# Patient Record
Sex: Male | Born: 1950 | Race: White | Hispanic: No | Marital: Married | Smoking: Never smoker
Health system: Southern US, Community
[De-identification: ages and names within clinical notes are randomized; demographics above are authoritative.]

## PROBLEM LIST (undated history)

## (undated) DIAGNOSIS — I509 Heart failure, unspecified: Secondary | ICD-10-CM

## (undated) DIAGNOSIS — R1013 Epigastric pain: Secondary | ICD-10-CM

## (undated) DIAGNOSIS — I429 Cardiomyopathy, unspecified: Secondary | ICD-10-CM

## (undated) DIAGNOSIS — C159 Malignant neoplasm of esophagus, unspecified: Secondary | ICD-10-CM

## (undated) DIAGNOSIS — E669 Obesity, unspecified: Secondary | ICD-10-CM

## (undated) DIAGNOSIS — I10 Essential (primary) hypertension: Secondary | ICD-10-CM

## (undated) HISTORY — DX: Obesity, unspecified: E66.9

## (undated) HISTORY — DX: Malignant neoplasm of esophagus, unspecified: C15.9

## (undated) HISTORY — DX: Essential (primary) hypertension: I10

## (undated) HISTORY — DX: Heart failure, unspecified: I50.9

## (undated) HISTORY — DX: Hypercalcemia: E83.52

## (undated) HISTORY — DX: Cardiomyopathy, unspecified: I42.9

## (undated) HISTORY — DX: Epigastric pain: R10.13

---

## 2006-09-21 ENCOUNTER — Ambulatory Visit: Payer: Self-pay | Admitting: Cardiology

## 2006-09-21 ENCOUNTER — Inpatient Hospital Stay: Payer: Self-pay | Admitting: Internal Medicine

## 2006-09-21 ENCOUNTER — Other Ambulatory Visit: Payer: Self-pay

## 2006-09-22 ENCOUNTER — Other Ambulatory Visit: Payer: Self-pay

## 2006-10-02 ENCOUNTER — Ambulatory Visit: Payer: Self-pay | Admitting: Internal Medicine

## 2006-10-18 ENCOUNTER — Ambulatory Visit: Payer: Self-pay | Admitting: Internal Medicine

## 2006-10-31 ENCOUNTER — Ambulatory Visit: Payer: Self-pay | Admitting: Cardiology

## 2006-11-05 ENCOUNTER — Ambulatory Visit: Payer: Self-pay | Admitting: Internal Medicine

## 2006-11-08 ENCOUNTER — Ambulatory Visit: Payer: Self-pay | Admitting: Internal Medicine

## 2006-11-26 ENCOUNTER — Ambulatory Visit: Payer: Self-pay | Admitting: Internal Medicine

## 2006-11-27 ENCOUNTER — Ambulatory Visit: Payer: Self-pay | Admitting: Internal Medicine

## 2006-11-27 ENCOUNTER — Encounter: Payer: Self-pay | Admitting: Internal Medicine

## 2006-12-17 ENCOUNTER — Ambulatory Visit: Payer: Self-pay | Admitting: Gastroenterology

## 2006-12-19 ENCOUNTER — Ambulatory Visit: Payer: Self-pay | Admitting: Internal Medicine

## 2007-01-15 ENCOUNTER — Encounter: Payer: Self-pay | Admitting: Internal Medicine

## 2007-01-15 ENCOUNTER — Ambulatory Visit: Payer: Self-pay

## 2007-01-18 ENCOUNTER — Ambulatory Visit: Payer: Self-pay | Admitting: Internal Medicine

## 2007-01-22 ENCOUNTER — Ambulatory Visit: Payer: Self-pay | Admitting: Internal Medicine

## 2007-01-22 ENCOUNTER — Ambulatory Visit (HOSPITAL_COMMUNITY): Admission: RE | Admit: 2007-01-22 | Discharge: 2007-01-22 | Payer: Self-pay | Admitting: Internal Medicine

## 2007-02-06 ENCOUNTER — Ambulatory Visit: Payer: Self-pay | Admitting: Internal Medicine

## 2007-02-06 HISTORY — PX: OTHER SURGICAL HISTORY: SHX169

## 2007-02-11 ENCOUNTER — Ambulatory Visit: Payer: Self-pay | Admitting: Internal Medicine

## 2007-02-21 ENCOUNTER — Ambulatory Visit: Payer: Self-pay | Admitting: Internal Medicine

## 2007-02-21 ENCOUNTER — Inpatient Hospital Stay (HOSPITAL_COMMUNITY): Admission: RE | Admit: 2007-02-21 | Discharge: 2007-02-22 | Payer: Self-pay | Admitting: Internal Medicine

## 2007-03-07 ENCOUNTER — Encounter: Payer: Self-pay | Admitting: Internal Medicine

## 2007-03-07 ENCOUNTER — Ambulatory Visit: Payer: Self-pay

## 2007-03-07 ENCOUNTER — Ambulatory Visit: Payer: Self-pay | Admitting: Internal Medicine

## 2007-03-11 ENCOUNTER — Ambulatory Visit: Payer: Self-pay | Admitting: Internal Medicine

## 2007-03-25 ENCOUNTER — Ambulatory Visit: Payer: Self-pay | Admitting: Internal Medicine

## 2007-04-19 ENCOUNTER — Ambulatory Visit: Payer: Self-pay | Admitting: Internal Medicine

## 2007-05-28 ENCOUNTER — Ambulatory Visit: Payer: Self-pay

## 2007-05-28 ENCOUNTER — Encounter: Payer: Self-pay | Admitting: Internal Medicine

## 2007-06-07 ENCOUNTER — Ambulatory Visit: Payer: Self-pay | Admitting: Internal Medicine

## 2007-06-10 ENCOUNTER — Ambulatory Visit: Payer: Self-pay

## 2007-06-12 ENCOUNTER — Ambulatory Visit: Payer: Self-pay | Admitting: Internal Medicine

## 2007-06-25 ENCOUNTER — Ambulatory Visit: Payer: Self-pay | Admitting: Internal Medicine

## 2007-07-02 ENCOUNTER — Ambulatory Visit: Payer: Self-pay | Admitting: Internal Medicine

## 2007-07-02 LAB — CONVERTED CEMR LAB
CO2: 23 meq/L (ref 19–32)
Creatinine, Ser: 1.04 mg/dL (ref 0.40–1.50)
Sodium: 137 meq/L (ref 135–145)

## 2007-07-18 ENCOUNTER — Ambulatory Visit: Payer: Self-pay | Admitting: Internal Medicine

## 2007-08-10 DIAGNOSIS — K3189 Other diseases of stomach and duodenum: Secondary | ICD-10-CM

## 2007-08-10 DIAGNOSIS — R1013 Epigastric pain: Secondary | ICD-10-CM

## 2007-08-10 DIAGNOSIS — I10 Essential (primary) hypertension: Secondary | ICD-10-CM | POA: Insufficient documentation

## 2007-08-10 DIAGNOSIS — I509 Heart failure, unspecified: Secondary | ICD-10-CM | POA: Insufficient documentation

## 2007-08-10 DIAGNOSIS — E669 Obesity, unspecified: Secondary | ICD-10-CM

## 2007-09-09 ENCOUNTER — Ambulatory Visit: Payer: Self-pay | Admitting: Internal Medicine

## 2007-09-11 ENCOUNTER — Ambulatory Visit: Payer: Self-pay | Admitting: Internal Medicine

## 2007-11-27 ENCOUNTER — Encounter: Payer: Self-pay | Admitting: Internal Medicine

## 2007-11-27 ENCOUNTER — Ambulatory Visit: Payer: Self-pay | Admitting: Internal Medicine

## 2007-12-09 ENCOUNTER — Ambulatory Visit: Payer: Self-pay | Admitting: Internal Medicine

## 2007-12-13 ENCOUNTER — Ambulatory Visit: Payer: Self-pay | Admitting: Internal Medicine

## 2007-12-17 ENCOUNTER — Encounter: Payer: Self-pay | Admitting: Internal Medicine

## 2007-12-17 ENCOUNTER — Ambulatory Visit: Payer: Self-pay | Admitting: Cardiovascular Disease

## 2007-12-17 LAB — CONVERTED CEMR LAB
AST: 17 units/L (ref 0–37)
Albumin: 4 g/dL (ref 3.5–5.2)
Alkaline Phosphatase: 66 units/L (ref 39–117)
BUN: 12 mg/dL (ref 6–23)
CO2: 22 meq/L (ref 19–32)
Chloride: 104 meq/L (ref 96–112)
Cholesterol: 139 mg/dL (ref 0–200)
Creatinine, Ser: 1.07 mg/dL (ref 0.40–1.50)
Glucose, Bld: 96 mg/dL (ref 70–99)
Total Bilirubin: 0.4 mg/dL (ref 0.3–1.2)
Total CHOL/HDL Ratio: 7
Total Protein: 6.9 g/dL (ref 6.0–8.3)
Triglycerides: 267 mg/dL — ABNORMAL HIGH (ref <150)

## 2008-01-03 ENCOUNTER — Ambulatory Visit: Payer: Self-pay | Admitting: Cardiology

## 2008-01-03 LAB — CONVERTED CEMR LAB
Albumin, U: DETECTED %
Alpha 1, Urine: DETECTED % — AB
Alpha 2, Urine: DETECTED % — AB
Alpha-2-Globulin: 11.5 % (ref 7.1–11.8)
Beta, Urine: DETECTED % — AB
Free Kappa Lt Chains,Ur: 8.18 mg/dL — ABNORMAL HIGH (ref 0.04–1.51)
Free Kappa/Lambda Ratio: 13.86 — ABNORMAL HIGH (ref 0.46–4.00)
Free Lambda Lt Chains,Ur: 0.59 mg/dL (ref 0.08–1.01)
Gamma Globulin: 16.1 % (ref 11.1–18.8)
Total Protein, Urine-Ur/day: 100 mg/24hr (ref 10–140)

## 2008-01-08 ENCOUNTER — Ambulatory Visit: Payer: Self-pay | Admitting: Oncology

## 2008-01-09 LAB — CBC WITH DIFFERENTIAL (CANCER CENTER ONLY)
BASO#: 0.1 10*3/uL (ref 0.0–0.2)
Eosinophils Absolute: 0.4 10*3/uL (ref 0.0–0.5)
HGB: 14.8 g/dL (ref 13.0–17.1)
MCV: 76 fL — ABNORMAL LOW (ref 82–98)
MONO#: 0.4 10*3/uL (ref 0.1–0.9)
NEUT#: 5.6 10*3/uL (ref 1.5–6.5)
Platelets: 202 10*3/uL (ref 145–400)
RBC: 5.8 10*6/uL — ABNORMAL HIGH (ref 4.20–5.70)
WBC: 8.3 10*3/uL (ref 4.0–10.0)

## 2008-01-15 LAB — COMPREHENSIVE METABOLIC PANEL
ALT: 16 U/L (ref 0–53)
AST: 23 U/L (ref 0–37)
Albumin: 3.9 g/dL (ref 3.5–5.2)
BUN: 10 mg/dL (ref 6–23)
Chloride: 102 mEq/L (ref 96–112)
Creatinine, Ser: 1.13 mg/dL (ref 0.40–1.50)
Sodium: 135 mEq/L (ref 135–145)
Total Protein: 6.8 g/dL (ref 6.0–8.3)

## 2008-01-15 LAB — SPEP & IFE WITH QIG
Beta 2: 5.6 % (ref 3.2–6.5)
Gamma Globulin: 16.3 % (ref 11.1–18.8)
IgA: 344 mg/dL (ref 68–378)
IgG (Immunoglobin G), Serum: 1300 mg/dL (ref 694–1618)

## 2008-01-15 LAB — HEMOGLOBINOPATHY EVALUATION
Hemoglobin Other: 0 % (ref 0.0–0.0)
Hgb A2 Quant: 2.5 % (ref 2.2–3.2)

## 2008-01-15 LAB — FERRITIN: Ferritin: 40 ng/mL (ref 22–322)

## 2008-01-15 LAB — IRON AND TIBC
Iron: 60 ug/dL (ref 42–165)
TIBC: 383 ug/dL (ref 215–435)
UIBC: 323 ug/dL

## 2008-01-15 LAB — KAPPA/LAMBDA LIGHT CHAINS: Kappa:Lambda Ratio: 0.73 (ref 0.26–1.65)

## 2008-01-16 LAB — UIFE/LIGHT CHAINS/TP QN, 24-HR UR
Albumin, U: DETECTED
Beta, Urine: DETECTED — AB
Free Lambda Excretion/Day: 5.6 mg/d
Free Lambda Lt Chains,Ur: 0.56 mg/dL (ref 0.08–1.01)
Free Lt Chn Excr Rate: 85.5 mg/d
Time: 24 hours
Total Protein, Urine-Ur/day: 113 mg/d (ref 10–140)
Total Protein, Urine: 11.3 mg/dL
Volume, Urine: 1000 mL

## 2008-01-25 LAB — PTH RELATED PEPTIDE: PTH-related peptide: <0.2 pmol/L (ref <1.4)

## 2008-01-25 LAB — MAGNESIUM: Magnesium: 2.2 mg/dL (ref 1.5–2.5)

## 2008-03-11 ENCOUNTER — Ambulatory Visit (HOSPITAL_COMMUNITY): Admission: RE | Admit: 2008-03-11 | Discharge: 2008-03-11 | Payer: Self-pay | Admitting: Internal Medicine

## 2008-03-19 ENCOUNTER — Ambulatory Visit: Payer: Self-pay | Admitting: Internal Medicine

## 2008-03-27 ENCOUNTER — Ambulatory Visit: Payer: Self-pay | Admitting: Cardiology

## 2008-03-27 LAB — CONVERTED CEMR LAB
AST: 18 units/L (ref 0–37)
Albumin: 4.2 g/dL (ref 3.5–5.2)
Alkaline Phosphatase: 89 units/L (ref 39–117)
BUN: 13 mg/dL (ref 6–23)
Calcium: 11.5 mg/dL — ABNORMAL HIGH (ref 8.4–10.5)
Chloride: 104 meq/L (ref 96–112)
Cholesterol: 176 mg/dL (ref 0–200)
Creatinine, Ser: 1.21 mg/dL (ref 0.40–1.50)
Potassium: 4.7 meq/L (ref 3.5–5.3)
Sodium: 141 meq/L (ref 135–145)
Total Bilirubin: 0.4 mg/dL (ref 0.3–1.2)
Total Protein: 7.2 g/dL (ref 6.0–8.3)
VLDL: 30 mg/dL (ref 0–40)

## 2008-04-24 ENCOUNTER — Ambulatory Visit: Payer: Self-pay | Admitting: Cardiology

## 2008-04-24 LAB — CONVERTED CEMR LAB
BUN: 10 mg/dL (ref 6–23)
Glucose, Bld: 87 mg/dL (ref 70–99)
Sodium: 137 meq/L (ref 135–145)

## 2008-04-27 ENCOUNTER — Ambulatory Visit: Payer: Self-pay | Admitting: Oncology

## 2008-04-27 ENCOUNTER — Ambulatory Visit: Payer: Self-pay | Admitting: Internal Medicine

## 2008-07-15 ENCOUNTER — Encounter (INDEPENDENT_AMBULATORY_CARE_PROVIDER_SITE_OTHER): Payer: Self-pay | Admitting: *Deleted

## 2008-07-15 ENCOUNTER — Encounter: Payer: Self-pay | Admitting: Internal Medicine

## 2008-07-15 ENCOUNTER — Ambulatory Visit: Payer: Self-pay | Admitting: Internal Medicine

## 2008-07-15 DIAGNOSIS — E785 Hyperlipidemia, unspecified: Secondary | ICD-10-CM

## 2008-07-15 DIAGNOSIS — I951 Orthostatic hypotension: Secondary | ICD-10-CM

## 2008-07-15 DIAGNOSIS — I10 Essential (primary) hypertension: Secondary | ICD-10-CM | POA: Insufficient documentation

## 2008-07-15 DIAGNOSIS — I5022 Chronic systolic (congestive) heart failure: Secondary | ICD-10-CM

## 2008-07-16 ENCOUNTER — Encounter: Payer: Self-pay | Admitting: Internal Medicine

## 2008-07-23 ENCOUNTER — Ambulatory Visit: Payer: Self-pay | Admitting: Oncology

## 2008-07-27 ENCOUNTER — Ambulatory Visit: Payer: Self-pay | Admitting: Internal Medicine

## 2008-08-18 ENCOUNTER — Encounter: Payer: Self-pay | Admitting: Internal Medicine

## 2008-08-18 ENCOUNTER — Ambulatory Visit: Payer: Self-pay

## 2008-10-26 ENCOUNTER — Ambulatory Visit: Payer: Self-pay | Admitting: Internal Medicine

## 2008-11-19 ENCOUNTER — Encounter: Payer: Self-pay | Admitting: Internal Medicine

## 2008-11-24 ENCOUNTER — Telehealth: Payer: Self-pay | Admitting: Internal Medicine

## 2008-11-27 ENCOUNTER — Ambulatory Visit: Payer: Self-pay | Admitting: Internal Medicine

## 2009-01-31 ENCOUNTER — Encounter: Payer: Self-pay | Admitting: Internal Medicine

## 2009-02-01 ENCOUNTER — Ambulatory Visit: Payer: Self-pay | Admitting: Internal Medicine

## 2009-02-22 ENCOUNTER — Telehealth: Payer: Self-pay | Admitting: Internal Medicine

## 2009-02-25 ENCOUNTER — Encounter: Payer: Self-pay | Admitting: Internal Medicine

## 2009-03-05 ENCOUNTER — Telehealth (INDEPENDENT_AMBULATORY_CARE_PROVIDER_SITE_OTHER): Payer: Self-pay | Admitting: *Deleted

## 2009-05-10 ENCOUNTER — Telehealth: Payer: Self-pay | Admitting: Internal Medicine

## 2009-06-24 ENCOUNTER — Encounter: Payer: Self-pay | Admitting: Internal Medicine

## 2009-06-24 ENCOUNTER — Ambulatory Visit: Payer: Self-pay | Admitting: Internal Medicine

## 2009-06-24 DIAGNOSIS — R9431 Abnormal electrocardiogram [ECG] [EKG]: Secondary | ICD-10-CM | POA: Insufficient documentation

## 2009-10-25 ENCOUNTER — Telehealth: Payer: Self-pay | Admitting: Internal Medicine

## 2009-12-17 ENCOUNTER — Encounter (INDEPENDENT_AMBULATORY_CARE_PROVIDER_SITE_OTHER): Payer: Self-pay | Admitting: *Deleted

## 2009-12-28 ENCOUNTER — Telehealth: Payer: Self-pay | Admitting: Internal Medicine

## 2010-01-21 ENCOUNTER — Ambulatory Visit: Payer: Self-pay | Admitting: Internal Medicine

## 2010-02-02 ENCOUNTER — Telehealth: Payer: Self-pay | Admitting: Internal Medicine

## 2010-02-09 ENCOUNTER — Telehealth: Payer: Self-pay | Admitting: Internal Medicine

## 2010-02-14 ENCOUNTER — Ambulatory Visit: Payer: Self-pay | Admitting: Internal Medicine

## 2010-02-22 LAB — CONVERTED CEMR LAB
BUN: 10 mg/dL (ref 6–23)
Basophils Absolute: 0.1 10*3/uL (ref 0.0–0.1)
Basophils Relative: 1 % (ref 0–1)
CO2: 25 meq/L (ref 19–32)
Calcium: 11.3 mg/dL — ABNORMAL HIGH (ref 8.4–10.5)
Chloride: 109 meq/L (ref 96–112)
Creatinine, Ser: 1.54 mg/dL — ABNORMAL HIGH (ref 0.40–1.50)
Eosinophils Absolute: 0.5 10*3/uL (ref 0.0–0.7)
Glucose, Bld: 83 mg/dL (ref 70–99)
MCV: 75.5 fL — ABNORMAL LOW (ref 78.0–100.0)
Neutro Abs: 6.8 10*3/uL (ref 1.7–7.7)
Potassium: 4.1 meq/L (ref 3.5–5.3)
RBC: 5.59 M/uL (ref 4.22–5.81)
RDW: 16.3 % — ABNORMAL HIGH (ref 11.5–15.5)
WBC: 11 10*3/uL — ABNORMAL HIGH (ref 4.0–10.5)

## 2010-03-07 ENCOUNTER — Ambulatory Visit: Payer: Self-pay | Admitting: Gastroenterology

## 2010-03-07 ENCOUNTER — Encounter: Payer: Self-pay | Admitting: Gastroenterology

## 2010-03-08 DIAGNOSIS — C159 Malignant neoplasm of esophagus, unspecified: Secondary | ICD-10-CM

## 2010-03-08 HISTORY — DX: Malignant neoplasm of esophagus, unspecified: C15.9

## 2010-03-10 LAB — PATHOLOGY REPORT

## 2010-03-15 ENCOUNTER — Ambulatory Visit: Payer: Self-pay | Admitting: Gastroenterology

## 2010-03-15 ENCOUNTER — Encounter: Payer: Self-pay | Admitting: Gastroenterology

## 2010-03-17 ENCOUNTER — Ambulatory Visit: Payer: Self-pay | Admitting: Internal Medicine

## 2010-03-21 ENCOUNTER — Ambulatory Visit: Payer: Self-pay | Admitting: Internal Medicine

## 2010-03-21 ENCOUNTER — Encounter: Payer: Self-pay | Admitting: Gastroenterology

## 2010-03-22 ENCOUNTER — Ambulatory Visit: Payer: Self-pay | Admitting: Internal Medicine

## 2010-03-23 ENCOUNTER — Encounter (INDEPENDENT_AMBULATORY_CARE_PROVIDER_SITE_OTHER): Payer: Self-pay | Admitting: *Deleted

## 2010-03-23 ENCOUNTER — Telehealth (INDEPENDENT_AMBULATORY_CARE_PROVIDER_SITE_OTHER): Payer: Self-pay | Admitting: *Deleted

## 2010-03-23 DIAGNOSIS — C159 Malignant neoplasm of esophagus, unspecified: Secondary | ICD-10-CM | POA: Insufficient documentation

## 2010-03-24 ENCOUNTER — Ambulatory Visit (HOSPITAL_COMMUNITY)
Admission: RE | Admit: 2010-03-24 | Discharge: 2010-03-24 | Payer: Self-pay | Source: Home / Self Care | Admitting: Gastroenterology

## 2010-03-24 ENCOUNTER — Ambulatory Visit: Payer: Self-pay | Admitting: Gastroenterology

## 2010-04-07 ENCOUNTER — Ambulatory Visit: Payer: Self-pay | Admitting: Internal Medicine

## 2010-04-12 ENCOUNTER — Ambulatory Visit: Payer: Self-pay | Admitting: Internal Medicine

## 2010-05-06 ENCOUNTER — Inpatient Hospital Stay: Payer: Self-pay | Admitting: Internal Medicine

## 2010-05-08 ENCOUNTER — Ambulatory Visit: Payer: Self-pay | Admitting: Internal Medicine

## 2010-06-01 ENCOUNTER — Ambulatory Visit
Admission: RE | Admit: 2010-06-01 | Discharge: 2010-06-01 | Payer: Self-pay | Source: Home / Self Care | Attending: Internal Medicine | Admitting: Internal Medicine

## 2010-06-01 ENCOUNTER — Encounter: Payer: Self-pay | Admitting: Internal Medicine

## 2010-06-08 ENCOUNTER — Ambulatory Visit: Payer: Self-pay | Admitting: Internal Medicine

## 2010-06-08 NOTE — Letter (Signed)
Summary: Device-Delinquent Check  [facility], Main Office  [address]   [place], [place] [address]   Phone: [phone]  Fax: [phone]     December 17, 2009 MRN: [id number]   [name] [address] [place], [place]  [address]   Dear Luke Gill,  According to our records, you have not had your implanted device checked in the recommended period of time.  We are unable to determine appropriate device function without checking your device on a regular basis.  Please call our office to schedule an appointment, with Dr. Graciela Husbands,  as soon as possible.  If you are having your device checked by another physician, please call us so that we may update our records.  Thank you, Altha Harm, LPN  December 17, 2009 3:26 PM   [facility]

## 2010-06-08 NOTE — Progress Notes (Signed)
Summary: BP PROBLEMS   Phone Note Call from Patient Call back at [phone] EXT [phone]   Caller: SELF Call For: BENSIMHON Summary of Call: BP HAS DROPPED DRAMATICALLY-BEEN AS LOW AS 88/50 AND THE HIGHEST HAS BEEN 105/70 SINCE YESTERDAY Initial call taken by: Harlon Flor,  May 10, 2009 9:02 AM  Follow-up for Phone Call        pt states that he has had 1-2 episodes of lightheadedness, on the verge of blacking out since weekend.  states that he has been more active than normal over the last several days with taking down [employer]. admits that he has not been drinking as much fluids as he should.  he states that there is a fine balance since he has heart failure and should min. fluids but is told to drink plenty of fluids by his endocrinologist for parathyroidism.  encouraged pt to increase fluid intake a little and to monitor BP.  Will call back on Thur or Frid for f.u Follow-up by: Charlena Cross, RN, BSN,  May 10, 2009 12:18 PM

## 2010-06-08 NOTE — Progress Notes (Signed)
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 Dossie Arbour.  pt will call Dr. Christell Faith office. pt also wanted to make an appt to see paula in device clinic first available appt was sept [address] Benedict Needy, RN  December 28, 2009 2:42 PM

## 2010-06-08 NOTE — Progress Notes (Signed)
Summary: blood pressure  Medications Added LISINOPRIL 20 MG TABS (LISINOPRIL) Take 1 tablet by mouth once a day       Phone Note Call from Patient   Caller: Patient Summary of Call: For the past few days blood pressure running low and felt dizzy and lightheaded yesterday.  BP reading 78/58 with a HR of 90. Just got home from work and BP 86/62 and HR of 93.  Went on a CPAP machine 3 weeks ago and was told to watch his blood pressure because during the test his heart rate dropped from 100 to 85.  What should do?  Please leave a message on home phone or call the pat before 3:30 at work.  #[phone] Initial call taken by: Bishop Dublin, CMA,  February 09, 2010 10:01 AM  Follow-up for Phone Call        Spoke to pt BP's been low for the past few days. Started on Cpap 3 weeks ago. BP this morning was 98/80 but held metoprolol and lisinopril and spironlactone. Please advise.  Benedict Needy, RN  February 10, 2010 11:20 AM   Additional Follow-up for Phone Call Additional follow up Details #1::        decrease lisinopril to 20 once daily and lets see if that helps. check bmet, bnp and cbc on monday  Dolores Patty, MD, [facility]  February 10, 2010 7:29 PM     Additional Follow-up for Phone Call Additional follow up Details #2::    pt is aware of medication changes. Will continue monitoring BP. Follow-up by: Benedict Needy, RN,  February 11, 2010 2:29 PM  New/Updated Medications: LISINOPRIL 20 MG TABS (LISINOPRIL) Take 1 tablet by mouth once a day

## 2010-06-08 NOTE — Progress Notes (Signed)
Summary: RX   Phone Note Refill Request Call back at [phone] EXT [phone] Message from:  Patient on October 25, 2009 9:49 AM  Refills Requested: Medication #1:  ZOLOFT 50 MG TABS 1 by mouth daily  Medication #2:  PROTONIX 40 MG TBEC 1 by mouth daily [facility] IN [place]-PT STATES THAT THESE WENT BACK TO THE PHARMACY AS DENIED  Initial call taken by: Harlon Flor,  October 25, 2009 9:50 AM  Follow-up for Phone Call        Rx denied because need to contact PCP. Follow-up by: Bishop Dublin, CMA,  October 25, 2009 2:46 PM

## 2010-06-08 NOTE — Procedures (Signed)
Summary: [facility]  [facility]   Imported By: Lester Davenport 03/30/2010 09:40:52  _____________________________________________________________________  External Attachment:    Type:   Image     Comment:   External Document

## 2010-06-08 NOTE — Progress Notes (Signed)
Summary: schedule change   Phone Note Outgoing Call   Call placed by: Chales Abrahams CMA Duncan Dull),  March 23, 2010 1:44 PM Summary of Call: Endo called and wants to move pt up due to a cx in the schedule for tomorrow.  I have placed a call to the pt left message on machine to call back  Initial call taken by: Chales Abrahams CMA Duncan Dull),  March 23, 2010 1:44 PM  Follow-up for Phone Call        left message on machine to call back Chales Abrahams CMA Duncan Dull)  March 23, 2010 3:09 PM   I called Clydie Braun at ENDO anf let her know I have been unable to reach the pt to reschdule. Follow-up by: Chales Abrahams CMA Duncan Dull),  March 23, 2010 3:15 PM

## 2010-06-08 NOTE — Cardiovascular Report (Signed)
Summary: Office Visit   Office Visit   Imported By: Roderic Ovens 06/30/2009 16:21:23  _____________________________________________________________________  External Attachment:    Type:   Image     Comment:   External Document

## 2010-06-08 NOTE — Assessment & Plan Note (Signed)
Summary: Z6X      Allergies Added: NKDA  Visit Type:  Follow-up Primary Provider:  Elmer Ramp [facility]  CC:  no complaints.  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 His EF  recovered Echo 4/10 was 55-60%. He is status post defibrillator. He also has a history of morbid obesity, hypertension and severe orthostatic hypotension.  He returns today for routine followup. He really feels good. He is back to all of his activities. Denies any shortness of breath. He has not had any orthopnea, PND, lower extremity edema or chest pain. No ICD shocks. Occasionally feels fatigued in morning if he doesn't drink enough fluids. Orthostatic symptoms essentially resolved.   Current Medications (verified): 1)  Aspirin Adult Low Strength 81 Mg Tbec (Aspirin) .Marland Kitchen.. 1 By Mouth Daily 2)  Protonix 40 Mg Tbec (Pantoprazole Sodium) .Marland Kitchen.. 1 By Mouth Daily 3)  Zoloft 50 Mg Tabs (Sertraline Hcl) .Marland Kitchen.. 1 By Mouth Daily 4)  Spironolactone 25 Mg Tabs (Spironolactone) .... 1/2 By Mouth Daily 5)  Lisinopril 20 Mg Tabs (Lisinopril) .Marland Kitchen.. 1 By Mouth Two Times A Day 6)  Fish Oil 1000 Mg Caps (Omega-3 Fatty Acids) .Marland Kitchen.. 1 By Mouth Twice Daily 7)  Metoprolol Tartrate 50 Mg Tabs (Metoprolol Tartrate) .Marland Kitchen.. 1 By Mouth Twice A Day 8)  Niaspan 500 Mg Cr-Tabs (Niacin (Antihyperlipidemic)) .Marland Kitchen.. 1 By Mouth Daily 9)  Nitrostat 0.4 Mg Subl (Nitroglycerin) .Marland Kitchen.. 1 By Mouth As Needed 10)  Lasix 40 Mg Tabs (Furosemide) .Marland Kitchen.. 1 By Mouth As Needed 11)  Klor-Con M20 20 Meq Cr-Tabs (Potassium Chloride Crys Cr) .Marland Kitchen.. 1 By Mouth As Needed  Allergies (verified): No Known Drug Allergies  Past History:  Past Medical History: Last updated: 11/27/2008  1. 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 onset 2008. EF 10-15%. Cath with normal coronaries      b. ECHO 2/09. EF 45-50%.      c.  ECHO 4/10: 55-60%     c. s/p BiV-ICD  2. Hypertentsion c/b severe orthostatic hypotension  3. Dyspepsia  4. Obesity  5. Hypercalcemia  Review of Systems       As per HPI and past medical history; otherwise all systems negative.   Vital Signs:  Patient profile:   60 year old male Height:      75 inches Weight:      307.50 pounds Pulse rate:   70 / minute Pulse rhythm:   regular BP sitting:   130 / 82  (right arm) Cuff size:   large  Vitals Entered By: Charlena Cross, RN, BSN (June 24, 2009 2:45 PM)  Physical Exam  General:  Gen: well appearing. no resp difficulty HEENT: normal Neck: supple. no JVD. Carotids 2+ bilat; no bruits.  Cor: PMI nondisplaced. Regular rate & rhythm. No rubs, gallops, murmur. Lungs: clear Abdomen: obese. soft, nontender, nondistended. No hepatosplenomegaly. Good bowel sounds. Extremities: no cyanosis, clubbing, rash, edema. excoriattions BLE right > left Neuro: alert & orientedx3, cranial nerves grossly intact. moves all 4 extremities w/o difficulty. affect pleasant     ICD Specifications ICD Vendor:  St Jude     ICD Model Number:  [id number]     ICD Serial Number:  [id number] ICD DOI:  02/21/2007     ICD Implanting MD:  Sherryl Manges, MD  Lead 1:    Location: RA     DOI: 02/21/2007  Model #: [id number]     Serial #: [id number]     Status: active Lead 2:    Location: RV     DOI: 02/21/2007     Model #: [id number]     Serial #: [id number]     Status: active Lead 3:    Location: LV     DOI: 02/21/2007     Model #: [id number]     Serial #: [id number]     Status: active  Indications::  NICM, CHF   ICD Follow Up Remote Check?  No Battery Voltage:  2.95 V     Charge Time:  11.4 seconds     Battery Est. Longevity:  3.6 years Underlying rhythm:  SR ICD Dependent:  No       ICD Device Measurements Atrium:  Amplitude: 2.5 mV, Impedance: 540 ohms, Threshold: 0.75 V at 0.5 msec Right Ventricle:  Amplitude: 12 mV, Impedance: 450 ohms, Threshold: 0.5 V at 0.5  msec Left Ventricle:  Impedance: 580 ohms, Threshold: 1.0 V at 0.5 msec Shock Impedance: 55 ohms   Episodes MS Episodes:  0     Percent Mode Switch:  0     Coumadin:  No Shock:  0     ATP:  0     Nonsustained:  0     Atrial Pacing:  46%     Ventricular Pacing:  98%  Brady Parameters Mode DDD     Lower Rate Limit:  70     Upper Rate Limit 130 PAV 160     Sensed AV Delay:  130  Tachy Zones VF:  240     VT:  200     VT1:  176     Next Cardiology Appt Due:  09/05/2009 Tech Comments:  No parameter changes.  Device function normal.  ROV 3 months Dr. Graciela Husbands in [place]. Altha Harm, LPN  June 24, 2009 3:10 PM   Impression & Recommendations:  Problem # 1:  SYSTOLIC HEART FAILURE, CHRONIC (ICD-428.22) Doing great. EF has fully recovered. On good medication regimen. No change.  Problem # 2:  ORTHOSTATIC HYPOTENSION (ICD-458.0) Essentially resolved. Off midodrine. Keep hydrated.  Problem # 3:  ABNORMAL EKG (ICD-794.31)  His updated medication list for this problem includes:    Aspirin Adult Low Strength 81 Mg Tbec (Aspirin) .Marland Kitchen... 1 by mouth daily    Lisinopril 20 Mg Tabs (Lisinopril) .Marland Kitchen... 1 by mouth two times a day    Metoprolol Tartrate 50 Mg Tabs (Metoprolol tartrate) .Marland Kitchen... 1 by mouth twice a day    Nitrostat 0.4 Mg Subl (Nitroglycerin) .Marland Kitchen... 1 by mouth as needed  Patient Instructions: 1)  Your physician recommends that you schedule a follow-up appointment in: 9 months 2)  Your physician recommends that you continue on your current medications as directed. Please refer to the Current Medication list given to you today.

## 2010-06-08 NOTE — Letter (Signed)
Summary: [facility]  [facility]   Imported By: Lester Egeland 03/30/2010 09:36:10  _____________________________________________________________________  External Attachment:    Type:   Image     Comment:   External Document

## 2010-06-08 NOTE — Progress Notes (Signed)
Summary: EUS   Phone Note Outgoing Call Call back at [phone]  ext [phone]   Call placed by: Chales Abrahams CMA Duncan Dull),  March 23, 2010 8:44 AM Summary of Call: pt scheduled for EUS meds reviewed and pt instructed Initial call taken by: Chales Abrahams CMA Duncan Dull),  March 23, 2010 8:45 AM  New Problems: ADENOCARCINOMA, ESOPHAGUS (ICD-150.9)   New Problems: ADENOCARCINOMA, ESOPHAGUS (ICD-150.9)

## 2010-06-08 NOTE — Procedures (Signed)
Summary: Endoscopic Ultrasound  Patient: Luke Gill Note: All result statuses are Final unless otherwise noted.  Tests: (1) Endoscopic Ultrasound (EUS)  EUS Endoscopic Ultrasound                             DONE     [facility]     [address] [place], [place]  [address]           ENDOSCOPIC ULTRASOUND PROCEDURE REPORT           PATIENT:  Luke, Gill  MR#:  [id number]     BIRTHDATE:  [birth year]  GENDER:  male     ENDOSCOPIST:  Rachael Fee, MD     REFERRED BY:  Janese Banks, M.D.     PROCEDURE DATE:  03/24/2010     PROCEDURE:  Upper EUS     ASA CLASS:  Class II     INDICATIONS:  recently diagnosed esophageal adenocarcinoma, PET     scan yesterday suggested PET avid mesenteric adenopathy     MEDICATIONS:   Fentanyl 75 mcg IV, Versed 7.5 mg IV     DESCRIPTION OF PROCEDURE:   After the risks benefits and     alternatives of the procedure were  explained, informed consent     was obtained. The patient was then placed in the left, lateral,     decubitus postion and IV sedation was administered. Throughout the     procedure, the patient's blood pressure, pulse and oxygen     saturations were monitored continuously.  Under direct     visualization, the Pentax Radial EUS [id number] endoscope was     introduced through the mouth and advanced to the distal esophagus.     Water was used as necessary to provide an acoustic interface.     Upon completion of the imaging, water was removed and the patient     was sent to the recovery room in satisfactory condition.     <<PROCEDUREIMAGES>>           Endoscopic findings (using radial echoendoscope and standard adult     gastroscope):     1. Malignant, circumferential mass creating a nearly obstructing     stricture at GE junction at 40cm from incisors. The lumen through     the stricture was 2-47mm, could not be passed.     2. 5cm of Barrett's appearing mucosa above the mass.           EUS findings:     1. The mass  above correlated with a 9mm thick, hypoechoic,     circumferential lesion that clearly passed into the muscularis     propria layer but not through it (uT2).     2. 8mm round, well circumscribed, hypoechoic paraesophageal     lymphnode adjacent to the mass at about 38cm from incisors.  This     was suspicious for malignant involvement (uN1).           Impression:     uT2N1 circumferential, nearly obstructing esophageal     adenocarcinoma at GE junction (40cm from incisors).  He should be     considered for chemo/XRT, is already planning to return to see Dr.     Sherrlyn Hock early next week. Would recommend starting treatment soon as     it seems the stricture is tightening. He knows to continue taking     in  high calorie liquids, soft foods as best as possible.           ______________________________     Rachael Fee, MD           cc: Lurline Del, MD           n.     eSIGNED:   Rachael Fee at 03/24/2010 03:28 PM           Cherlyn Cushing, [id number]  Note: An exclamation mark (!) indicates a result that was not dispersed into the flowsheet. Document Creation Date: 03/24/2010 3:28 PM _______________________________________________________________________  (1) Order result status: Final Collection or observation date-time: 03/24/2010 14:55 Requested date-time:  Receipt date-time:  Reported date-time:  Referring Physician:   Ordering Physician: Rob Bunting [phone]) Specimen Source:  Source: Launa Grill Order Number: [id number] Lab site:

## 2010-06-08 NOTE — Procedures (Signed)
Summary: F3M/AMD  Medications Added * SENOKOT S 58MG  one tablet two times a day * DEXAMETHASONE 120 MG 120 mg night before chemo and 80 mg  a.m. of chemo SENSIPAR 30 MG TABS (CINACALCET HCL) two daily      Allergies Added: NKDA  Visit Type:  Follow-up Primary Provider:  Elmer Ramp [facility]  CC:  Recently diagnosed with stage four esophageal cancer; had two treatments of chemo and radiation x 3.  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 His EF  recovered Echo 4/10 was 55-60%.but redent echo shows intercurrent worening to  35 % to 40 %.  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  he is on chemo and XRT and is swallowing better    He also has a history of morbid obesity, hypertension and severe orthostatic hypotension. a recent fall was assoc with CPAP and nocturnal micturition.  he is not wearing his cpap at htis pooint     Current Medications (verified): 1)  Aspirin Adult Low Strength 81 Mg Tbec (Aspirin) .Marland Kitchen.. 1 By Mouth Daily 2)  Protonix 40 Mg Tbec (Pantoprazole Sodium) .Marland Kitchen.. 1 By Mouth Daily 3)  Zoloft 50 Mg Tabs (Sertraline Hcl) .Marland Kitchen.. 1 By Mouth Daily 4)  Spironolactone 25 Mg Tabs (Spironolactone) .... 1/2 By Mouth Daily 5)  Lisinopril 20 Mg Tabs (Lisinopril) .... Take 1 Tablet By Mouth Once A Day 6)  Fish Oil 1000 Mg Caps (Omega-3 Fatty Acids) .Marland Kitchen.. 1 By Mouth Twice Daily 7)  Metoprolol Tartrate 50 Mg Tabs (Metoprolol Tartrate) .Marland Kitchen.. 1 By Mouth Twice A Day 8)  Nitrostat 0.4 Mg Subl (Nitroglycerin) .Marland Kitchen.. 1 By Mouth As Needed 9)  Senokot S 58mg  .... One Tablet Two Times A Day 10)  Dexamethasone 120 Mg .Marland KitchenMarland KitchenMarland Kitchen 120 Mg Night Before Chemo and 80 Mg  A.m. of Chemo 11)  Sensipar 30 Mg Tabs (Cinacalcet Hcl) .... Two Daily  Allergies (verified): No Known Drug Allergies  Past  History:  Past Medical History: Last updated: 11/27/2008  1. 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 onset 2008. EF 10-15%. Cath with normal coronaries      b. ECHO 2/09. EF 45-50%.      c. ECHO 4/10: 55-60%     c. s/p BiV-ICD  2. Hypertentsion c/b severe orthostatic hypotension  3. Dyspepsia  4. Obesity  5. Hypercalcemia  Past Surgical History: Last updated: 08/10/2007 ICD Placement (02/2007)  Vital Signs:  Patient profile:   60 year old male Height:      75 inches Weight:      274 pounds BMI:     34.37 Pulse rate:   73 / minute BP supine:   120 / 82 BP sitting:   118 / 80  (left arm) BP standing:   100 / 76 Cuff size:   large  Vitals Entered By: Bishop Dublin, CMA (April 12, 2010 10:16 AM)  Physical Exam  General:  The patient was alert and oriented in no acute distress. HEENT Normal.  Neck veins were flat, carotids were brisk.  Lungs were clear.  Heart sounds were regular without murmurs or gallops.  Abdomen was soft with active bowel sounds. There is no clubbing cyanosis or edema. Skin Warm and dry     ICD Specifications Following MD:  Sherryl Manges, MD  ICD Vendor:  St Jude     ICD Model Number:  [id number]     ICD Serial Number:  [id number] ICD DOI:  02/21/2007     ICD Implanting MD:  Sherryl Manges, MD  Lead 1:    Location: RA     DOI: 02/21/2007     Model #: [id number]     Serial #: [id number]     Status: active Lead 2:    Location: RV     DOI: 02/21/2007     Model #: 3664     Serial #: [id number]     Status: active Lead 3:    Location: LV     DOI: 02/21/2007     Model #: 1156T     Serial #: [id number]     Status: active  Indications::  NICM, CHF   ICD Follow Up Remote Check?  No Battery Voltage:  2.63 V     Charge Time:  12.6 seconds     Battery Est. Longevity:  2.7 years Underlying rhythm:  SR ICD Dependent:  No       ICD Device Measurements Atrium:  Amplitude: 3.4 mV, Impedance: 450 ohms, Threshold: 0.75 V at 0.5 msec Right Ventricle:  Amplitude: 12  mV, Impedance: 410 ohms, Threshold: 0.5 V at 0.5 msec Left Ventricle:  Impedance: 480 ohms, Threshold: 0.75 V at 0.5 msec Shock Impedance: 49 ohms   Episodes MS Episodes:  0     Percent Mode Switch:  0     Coumadin:  No Shock:  0     ATP:  0     Nonsustained:  0     Atrial Pacing:  27%     Ventricular Pacing:  99%  Brady Parameters Mode DDD     Lower Rate Limit:  70     Upper Rate Limit 130 PAV 160     Sensed AV Delay:  110  Tachy Zones VF:  240     VT:  200     VT1:  176     Next Cardiology Appt Due:  07/07/2010 Tech Comments:  No parameter changes.  Device function normal.   No Merlin @ this time.  ROV 3 months [place] [facility]. Altha Harm, LPN  April 12, 2010 10:30 AM   Impression & Recommendations:  Problem # 1:  SYSTOLIC HEART FAILURE, CHRONIC (ICD-428.22) stable  His updated medication list for this problem includes:    Aspirin Adult Low Strength 81 Mg Tbec (Aspirin) .Marland Kitchen... 1 by mouth daily    Spironolactone 25 Mg Tabs (Spironolactone) .Marland Kitchen... 1/2 by mouth daily    Lisinopril 20 Mg Tabs (Lisinopril) .Marland Kitchen... Take 1 tablet by mouth once a day    Metoprolol Tartrate 50 Mg Tabs (Metoprolol tartrate) .Marland Kitchen... 1 by mouth twice a day    Nitrostat 0.4 Mg Subl (Nitroglycerin) .Marland Kitchen... 1 by mouth as needed  Problem # 2:  ORTHOSTATIC HYPOTENSION (ICD-458.0) discussed maneuvers  Problem # 3:  CARDIAC DEFIBRILLATOR -CRT (ICD-V45.02) Device parameters and data were reviewed and no changes were made  Patient Instructions: 1)  Your physician recommends that you schedule a follow-up appointment in: 3 months 2)  Your physician recommends that you continue on your current medications as directed. Please refer to the Current Medication list given to you today.

## 2010-06-08 NOTE — Letter (Signed)
Summary: EGD Instructions  [facility]  [address] [place], [place] [address]   Phone: [phone]  Fax: [phone]       Luke Gill    1950/06/07    MRN: [id number]       Procedure Day /Date:03/24/10  THURS     Arrival Time:130 pm     Procedure Time:230 pm     Location of Procedure:                     X  [facility] ( Outpatient Registration)    PREPARATION FOR ENDOSCOPY   On 03/24/10  THE DAY OF THE PROCEDURE:  1.   No solid foods, milk or milk products are allowed after midnight the night before your procedure.  2.   Do not drink anything colored red or purple.  Avoid juices with pulp.  No orange juice.  3.  You may drink clear liquids until 1030 am , which is 4 hours before your procedure.                                                                                                CLEAR LIQUIDS INCLUDE: Water Jello Ice Popsicles Tea (sugar ok, no milk/cream) Powdered fruit flavored drinks Coffee (sugar ok, no milk/cream) Gatorade Juice: apple, white grape, white cranberry  Lemonade Clear bullion, consomm, broth Carbonated beverages (any kind) Strained chicken noodle soup Hard Candy   MEDICATION INSTRUCTIONS  Unless otherwise instructed, you should take regular prescription medications with a small sip of water as early as possible the morning of your procedure.               OTHER INSTRUCTIONS  You will need a responsible adult at least 59 years of age to accompany you and drive you home.   This person must remain in the waiting room during your procedure.  Wear loose fitting clothing that is easily removed.  Leave jewelry and other valuables at home.  However, you may wish to bring a book to read or an iPod/MP3 player to listen to music as you wait for your procedure to start.  Remove all body piercing jewelry and leave at home.  Total time from sign-in until discharge is approximately 2-3 hours.  You  should go home directly after your procedure and rest.  You can resume normal activities the day after your procedure.  The day of your procedure you should not:   Drive   Make legal decisions   Operate machinery   Drink alcohol   Return to work  You will receive specific instructions about eating, activities and medications before you leave.    The above instructions have been reviewed and explained to me by   Chales Abrahams CMA Duncan Dull)  March 23, 2010 8:51 AM     I fully understand and can verbalize these instructions over the phone Date 03/23/10

## 2010-06-08 NOTE — Progress Notes (Signed)
Summary: Niaspan   Phone Note Call from Patient   Caller: Patient Call For: Bensimhon Summary of Call: Pt called has been taking niaspan for 9-11 months has started to experience some nausea and vomiting has noticed that a side effect of the niaspan is N/V. Was wondering if he could stop the niaspan to see if his symptoms improved. Please advise.  Initial call taken by: Benedict Needy, RN,  February 02, 2010 9:07 AM  Follow-up for Phone Call        yes it is ok to stop. we discussed the Aim-High study data previously showing no CV benefits from Niaspan. Dolores Patty, MD, [facility]  February 02, 2010 9:58 AM  [facility] as pt requested. Benedict Needy, RN  February 02, 2010 4:19 PM      Appended Document: Niaspan    Clinical Lists Changes  Medications: Removed medication of NIASPAN 500 MG CR-TABS (NIACIN (ANTIHYPERLIPIDEMIC)) 1 by mouth daily

## 2010-06-08 NOTE — Cardiovascular Report (Signed)
Summary: Office Visit   Office Visit   Imported By: Roderic Ovens 02/01/2010 15:10:14  _____________________________________________________________________  External Attachment:    Type:   Image     Comment:   External Document

## 2010-06-08 NOTE — Procedures (Signed)
Summary: Debcheck/alt      Allergies Added: NKDA  Current Medications (verified): 1)  Aspirin Adult Low Strength 81 Mg Tbec (Aspirin) .Marland Kitchen.. 1 By Mouth Daily 2)  Protonix 40 Mg Tbec (Pantoprazole Sodium) .Marland Kitchen.. 1 By Mouth Daily 3)  Zoloft 50 Mg Tabs (Sertraline Hcl) .Marland Kitchen.. 1 By Mouth Daily 4)  Spironolactone 25 Mg Tabs (Spironolactone) .... 1/2 By Mouth Daily 5)  Lisinopril 20 Mg Tabs (Lisinopril) .Marland Kitchen.. 1 By Mouth Two Times A Day 6)  Fish Oil 1000 Mg Caps (Omega-3 Fatty Acids) .Marland Kitchen.. 1 By Mouth Twice Daily 7)  Metoprolol Tartrate 50 Mg Tabs (Metoprolol Tartrate) .Marland Kitchen.. 1 By Mouth Twice A Day 8)  Niaspan 500 Mg Cr-Tabs (Niacin (Antihyperlipidemic)) .Marland Kitchen.. 1 By Mouth Daily 9)  Nitrostat 0.4 Mg Subl (Nitroglycerin) .Marland Kitchen.. 1 By Mouth As Needed  Allergies (verified): No Known Drug Allergies    ICD Specifications Following MD:  Sherryl Manges, MD     ICD Vendor:  St Jude     ICD Model Number:  [id number]     ICD Serial Number:  [id number] ICD DOI:  02/21/2007     ICD Implanting MD:  Sherryl Manges, MD  Lead 1:    Location: RA     DOI: 02/21/2007     Model #: [id number]     Serial #: [id number]     Status: active Lead 2:    Location: RV     DOI: 02/21/2007     Model #: [id number]     Serial #: [id number]     Status: active Lead 3:    Location: LV     DOI: 02/21/2007     Model #: 1156T     Serial #: [id number]     Status: active  Indications::  NICM, CHF   ICD Follow Up Remote Check?  No Battery Voltage:  2.71 V     Charge Time:  12.4 seconds     Battery Est. Longevity:  3.0 years Underlying rhythm:  SR ICD Dependent:  No       ICD Device Measurements Atrium:  Amplitude: 3.3 mV, Impedance: 530 ohms, Threshold: 0.75 V at 0.5 msec Right Ventricle:  Amplitude: 12 mV, Impedance: 460 ohms, Threshold: 0.5 V at 0.5 msec Left Ventricle:  Impedance: 590 ohms, Threshold: 0.75 V at 0.5 msec Shock Impedance: 57 ohms   Episodes MS Episodes:  0     Percent Mode Switch:  0     Coumadin:  No Shock:  0     ATP:  0     Nonsustained:  1      Atrial Pacing:  36%     Ventricular Pacing:  100%  Brady Parameters Mode DDD     Lower Rate Limit:  70     Upper Rate Limit 130 PAV 160     Sensed AV Delay:  110  Tachy Zones VF:  240     VT:  200     VT1:  176     Next Cardiology Appt Due:  04/07/2010 Tech Comments:  Quick opt done today, SAV to .  1 VF aborted episode noted.  ROV 3 months with Dr. Graciela Husbands in [place].   Altha Harm, LPN  January 21, 2010 3:58 PM

## 2010-06-09 ENCOUNTER — Inpatient Hospital Stay: Payer: Self-pay | Admitting: Internal Medicine

## 2010-06-09 ENCOUNTER — Telehealth: Payer: Self-pay | Admitting: Internal Medicine

## 2010-06-10 DIAGNOSIS — I959 Hypotension, unspecified: Secondary | ICD-10-CM

## 2010-06-10 DIAGNOSIS — R9430 Abnormal result of cardiovascular function study, unspecified: Secondary | ICD-10-CM

## 2010-06-15 NOTE — Assessment & Plan Note (Signed)
Summary: EPH/AMD  Medications Added LISINOPRIL 10 MG TABS (LISINOPRIL) Take one tablet by mouth daily      Allergies Added: NKDA  Visit Type:  Initial Consult Primary Provider:  Elmer Ramp [facility]  CC:  c/o SOB. Denies chest pains and palpitations..  History of Present Illness: Luke Gill is a 60 y/o mle with a history of morbid obesity, OSA, hypertension and severe orthostatic hypotension. a recent fall was assoc with CPAP and nocturnal micturition. 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 His EF  recovered Echo 4/10 was 55-60%.but redent echo shows intercurrent worening to  35 % to 40 %.  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  Tumor was inoperable. He is on chemo and XRT.   Returns for f/u.  Just got out of the hospital after a 3 week admission for aspiration pneumonia. Now very weak but improving slowly. Feeding through a G-tube. Doing rehab. Has 2 more XRT and chemo treatments left.   BP has been running very low with systolics in the 90s. No edema. No CP.   Preventive Screening-Counseling & Management  Alcohol-Tobacco     Smoking Status: never  Caffeine-Diet-Exercise     Does Patient Exercise: no      Drug Use:  no.    Current Medications (verified): 1)  Aspirin Adult Low Strength 81 Mg Tbec (Aspirin) .Marland Kitchen.. 1 By Mouth Daily 2)  Protonix 40 Mg Tbec (Pantoprazole Sodium) .Marland Kitchen.. 1 By Mouth Daily 3)  Zoloft 50 Mg Tabs (Sertraline Hcl) .Marland Kitchen.. 1 By Mouth Daily 4)  Spironolactone 25 Mg Tabs (Spironolactone) .... 1/2 By Mouth Daily 5)  Lisinopril 20 Mg Tabs (Lisinopril) .... Take 1 Tablet By Mouth Once A Day 6)  Fish Oil 1000 Mg Caps (Omega-3 Fatty Acids) .Marland Kitchen.. 1 By Mouth Twice Daily 7)  Metoprolol Tartrate 50 Mg Tabs (Metoprolol Tartrate) .Marland Kitchen.. 1 By Mouth Twice A Day 8)  Nitrostat 0.4 Mg Subl (Nitroglycerin) .Marland Kitchen.. 1 By Mouth As Needed 9)  Senokot S 58mg  .... One Tablet Two Times A Day 10)  Dexamethasone  120 Mg .Marland KitchenMarland KitchenMarland Kitchen 120 Mg Night Before Chemo and 80 Mg  A.m. of Chemo 11)  Sensipar 30 Mg Tabs (Cinacalcet Hcl) .... Two Daily  Allergies (verified): No Known Drug Allergies  Past History:  Past Medical History: Last updated: 11/27/2008  1. 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 onset 2008. EF 10-15%. Cath with normal coronaries      b. ECHO 2/09. EF 45-50%.      c. ECHO 4/10: 55-60%     c. s/p BiV-ICD  2. Hypertentsion c/b severe orthostatic hypotension  3. Dyspepsia  4. Obesity  5. Hypercalcemia  Past Surgical History: Last updated: 08/10/2007 ICD Placement (02/2007)  Family History: Last updated: 06/01/2010 adopted- no hx  Social History: Last updated: 06/01/2010 Retired  Married  Tobacco Use - No.  Alcohol Use - no Regular Exercise - no Drug Use - no  Risk Factors: Exercise: no (06/01/2010)  Risk Factors: Smoking Status: never (06/01/2010)  Family History: adopted- no hx  Social History: Retired  Married  Tobacco Use - No.  Alcohol Use - no Regular Exercise - no Drug Use - no Smoking Status:  never Does Patient Exercise:  no Drug Use:  no  Review of Systems       As per HPI and past medical history; otherwise all systems negative.   Vital Signs:  Patient profile:   60  year old male Height:      75 inches Weight:      257.75 pounds Pulse rate:   101 / minute BP sitting:   107 / 62  (left arm) Cuff size:   large  Vitals Entered By: Lysbeth Galas CMA (June 01, 2010 2:14 PM)   Physical Exam  General:  Weak. Sitting in W-C HEENT Normal.  Neck veins were flat, carotids were brisk.  Lungs were clear.  Heart sounds were regular without murmurs or gallops.  Abdomen was soft with active bowel sounds. + G-tube There is no clubbing cyanosis or edema. dressing on right shin Skin Warm and dry     ICD Specifications Following MD:  Sherryl Manges, MD     ICD Vendor:  St Jude     ICD Model Number:  [id number]     ICD Serial Number:  [id number] ICD  DOI:  02/21/2007     ICD Implanting MD:  Sherryl Manges, MD  Lead 1:    Location: RA     DOI: 02/21/2007     Model #: 1688TC     Serial #: [id number]     Status: active Lead 2:    Location: RV     DOI: 02/21/2007     Model #: 2956     Serial #: [id number]     Status: active Lead 3:    Location: LV     DOI: 02/21/2007     Model #: 1156T     Serial #: [id number]     Status: active  Indications::  NICM, CHF   ICD Follow Up ICD Dependent:  No      Episodes Coumadin:  No  Brady Parameters Mode DDD     Lower Rate Limit:  70     Upper Rate Limit 130 PAV 160     Sensed AV Delay:  110  Tachy Zones VF:  240     VT:  200     VT1:  176     Impression & Recommendations:  Problem # 1:  SYSTOLIC HEART FAILURE, CHRONIC (ICD-428.22) Volume status looks good. BP running low. Cut lisinopril to 10 once daily and d/c spiro. Repeat echo to reassess EF after chemo.   Orders: Echocardiogram (Echo)  Other Orders: EKG w/ Interpretation (93000)  Patient Instructions: 1)  Your physician recommends that you schedule a follow-up appointment in: 3 MONTHS 2)  Your physician has recommended you make the following change in your medication: STOP Spironolactone. DECREASE Lisinopril to 10mg . 3)  Your physician has requested that you have an echocardiogram.  Echocardiography is a painless test that uses sound waves to create images of your heart. It provides your doctor with information about the size and shape of your heart and how well your heart's chambers and valves are working.  This procedure takes approximately one hour. There are no restrictions for this procedure. Prescriptions: LISINOPRIL 10 MG TABS (LISINOPRIL) Take one tablet by mouth daily  #30 x 6   Entered by:   Lanny Hurst RN   Authorized by:   Dolores Patty, MD, [facility]   Signed by:   Lanny Hurst RN on 06/01/2010   Method used:   Electronically to        [facility]. # [phone]* (retail)       [address]       [place],  [place]  [address]  Ph: [phone]       Fax: [phone]   RxID:   [id number]

## 2010-06-15 NOTE — Progress Notes (Signed)
Summary: BP decreased   Phone Note Call from Patient   Caller: Patient Details for Reason: Blood Pressure [phone] Summary of Call: Would like to speak to the nurse about decrease blood pressure.  He past out this a.m., fell to the floor with  BP 94/65 doesn't know heart rate. He has been dealing with his blood pressure issue like this for a while.  Dr. Gala Romney is aware and @ last office visit decreased the Lisinopril 10mg  once daily.  He does feel lightheaded.     Initial call taken by: Bishop Dublin, CMA,  June 09, 2010 9:02 AM  Follow-up for Phone Call        The patient does not want to come in at this point; having difficulty with walking. He just wants medication cut back for his blood pressure issues.  What do you recommend for BP?   Follow-up by: Bishop Dublin, CMA,  June 09, 2010 9:04 AM  Additional Follow-up for Phone Call Additional follow up Details #1::        Dr. Graciela Husbands spoke with the patient's wife.  Wife states patient lying on the couch with blood pressure reading 64/48.  Dr. Graciela Husbands told patient's wife to have EMS take him to the ER. Additional Follow-up by: Bishop Dublin, CMA,  June 09, 2010 4:17 PM

## 2010-06-21 ENCOUNTER — Other Ambulatory Visit: Payer: Self-pay

## 2010-07-07 ENCOUNTER — Ambulatory Visit: Payer: Self-pay | Admitting: Internal Medicine

## 2010-07-12 ENCOUNTER — Encounter: Payer: Self-pay | Admitting: Internal Medicine

## 2010-07-14 ENCOUNTER — Other Ambulatory Visit: Payer: Self-pay | Admitting: Internal Medicine

## 2010-07-14 ENCOUNTER — Other Ambulatory Visit (INDEPENDENT_AMBULATORY_CARE_PROVIDER_SITE_OTHER): Payer: Managed Care, Other (non HMO)

## 2010-07-14 DIAGNOSIS — I5022 Chronic systolic (congestive) heart failure: Secondary | ICD-10-CM

## 2010-08-07 ENCOUNTER — Ambulatory Visit: Payer: Self-pay | Admitting: Internal Medicine

## 2010-09-06 ENCOUNTER — Ambulatory Visit: Payer: Self-pay | Admitting: Internal Medicine

## 2010-09-16 ENCOUNTER — Ambulatory Visit: Payer: Self-pay

## 2010-09-20 NOTE — Discharge Summary (Signed)
Luke Gill, MOGAN               ACCOUNT NO.:  [id number]   MEDICAL RECORD NO.:  [id number]          PATIENT TYPE:  INP   LOCATION:  3740                         FACILITY:  [facility]   PHYSICIAN:  Maple Mirza, PA   DATE OF BIRTH:  12-03-1950   DATE OF ADMISSION:  02/21/2007  DATE OF DISCHARGE:  02/22/2007                               DISCHARGE SUMMARY   Exam and discharge greater than 35 minutes.   This patient has no known drug allergies.   FINAL DIAGNOSIS:  Discharging day one status post implant of a St. Jude  PROMOTE RF BiV ICD.   SECONDARY DIAGNOSES:  1. New York Heart Association class III chronic systolic congestive      heart failure.  2. Nonischemic cardiomyopathy, ejection fraction 20-25% at      echocardiogram January 15, 2007.  3. Left bundle branch block.  4. Hypertension.  5. Obstructive sleep apnea.  6. Obesity.   PROCEDURE:  February 21, 2007, implant of St. Jude cardioverter  defibrillator with left ventricular biventricular pacing capability, Dr.  Sherryl Manges.  The patient had defibrillator stress study at the time of  the implant.   BRIEF HISTORY:  Mr. Bolger is a 60 year old gentleman.  He has  nonischemic cardiomyopathy.  This was diagnosed in May 2008.  His  ejection fraction at that time was 10-15%.  It has risen to 25-30% with  optimal medical therapy.   His initial presentation with dyspnea on exertion was dyspnea on  exertion, orthopnea and peripheral edema.  At the time of this  examination October 1 his major symptom is exertional lightheadedness.  He has no history of syncope or palpitations.  The patient also has  hypertension, well-controlled and a history of sleep apnea.   Mr. Gerry is an appropriate candidate for cardioverter defibrillator.  There is a question that he might benefit from a left ventricular pacing  lead given IVCD.  The patient has undergone a CPX.   The patient reports dizziness and I have taken the liberty of  decreasing  his lisinopril from 40 to 10 with concentration on incrementally  increasing Coreg as this is allowed.  We will schedule the patient for  an elective implant of a cardioverter defibrillator.   HOSPITAL COURSE:  The patient presented electively February 21, 2007.  He  was a candidate for BiV ICD.  This procedure was done by Dr. Sherryl Manges and the patient has had no post procedural complications.  The  incision looks good without hematoma.  He is in sinus rhythm with V  pacing.  Blood pressure is 104/73, heart rate is 91.  His incision is  sore but controlled with oral analgesia.  He goes home with his  preprocedure medications which include:   DISCHARGE MEDICATIONS:  1. Enteric coated aspirin 81 mg daily.  2. Lanoxin 0.25 mg daily.  3. Lisinopril 10 mg daily.  4. Protonix 40 mg daily.  5. Aciphex 20 mg daily.  6. Spironolactone 25 mg daily.  7. Coreg 25 mg tablets 1/2 tablet twice daily.  8. Nitroglycerin 0.4 mg as needed.  9. Lasix 40 mg as needed.  10.Potassium chloride as needed.  11.Darvocet (this is new) N-100, 1-2 tablets every 4-6 hours as      needed.   He is asked to keep his incision dry for the next 7 days and to sponge  bathe until Thursday, October 23.  He follows up with [facility], the [facility] office Thursday, October 30 at 9:40, at the ICD  clinic and he will see Dr. Graciela Husbands in [place] in 3 months.   LABORATORY STUDIES:  This admission were taken, serum electrolytes -  sodium is 137, potassium 4.5, chloride is 104, carbonate is 22, glucose  is 75, BUN is 12, creatinine 1.39, white cells 10.5, hemoglobin 13.9,  hematocrit 41.7, platelets are 278, the INR 1.0, pro-time is 10.6.      Maple Mirza, PA     GM/MEDQ  D:  02/22/2007  T:  02/23/2007  Job:  [id number]   cc:   [name], MD  Duke Salvia, MD, [facility], Dr

## 2010-09-20 NOTE — Letter (Signed)
February 06, 2007    [name], MD  [address], [place] [address]   RE:  Luke, Gill  MRN:  [id number]  /  DOB:  November 04, 1950   Dear Luke Gill,   It was a pleasure to see Luke Gill today at your request regarding  consideration for ICD implantation.   Luke Gill is a 60 year old gentleman that was diagnosed with a  nonischemic cardiomyopathy in May that has persisted despite optimal  medical therapy with the ejection fraction having gone only from 10-15%  up to about 25-30%.   Initial presentation with dyspnea on exertion, orthopnea, and peripheral  edema has improved; his major symptoms now are orthostatic and  exertional lightheadedness.   This has been so debilitating that his Coreg dose has required down-  titration.  It has helped only a minimal amount.  He continues to have 2-  pillow orthopnea but no further nocturnal dyspnea or obvious peripheral  edema.   He went out and golfed 9 holes the other day but, again, his major  limitation with this was fatigue and lightheadedness; he was not  particularly dyspneic.   He has no history of syncope or palpitations.   His past medical history in addition to the above is notable for  pneumonia about a year ago.  He has hypertension which is currently well  controlled and a history of sleep apnea.   His family history is not obtainable as the patient was adopted.   SOCIAL HISTORY:  He works as a Product manager in a [employer]  He  does not use alcohol.  He has not smoked in years.  He is married.  He  has no children.   REVIEW OF SYSTEMS:  Notable for abdominal obesity as well as early a.m.  awakening.  He has also had a full mouth extraction.  That and his  recent cardiac condition have given rise to a fair amount of anxiety and  perhaps some depression.   CURRENT MEDICATIONS:  1. Lanoxin 0.25.  2. Lisinopril 40.  3. Protonix 40.  4. Aciphex 20.  5. Spironolactone 25.  6. Coreg 12.5  b.i.d.   He has no known drug allergies.   EXAMINATION:  He is a middle-aged Caucasian male appearing his stated  age of 79.  His blood pressure is 118/80.  His pulse is 78.  His weight is 282  pounds.  He is about 6 feet 3 inches.  HEENT:  Demonstrates no icterus nor xanthoma.  The neck veins were flat actually surprisingly.  The carotids were brisk  and full bilaterally without bruits.  BACK:  Without kyphosis, scoliosis.  LUNGS:  Clear.  Heart sounds were regular with a displaced PMI.  There was an early  systolic murmur.  ABDOMEN:  Soft, protuberant.  There was no hepatomegaly.  Femoral pulses were 2+.  Distal pulses were intact.  There is no  cyanosis, clubbing, or edema.  NEUROLOGIC:  Grossly normal.  SKIN:  Warm and dry.   Electrocardiogram dated 12 September demonstrated sinus rhythm with  intervals of 0.18/0.13/0.35.  The axis was leftward -67, and the  electrocardiogram was actually very interesting in that he has a broad  IVCD with limb lead suggestive of left bundle-branch block and  precordial lead evidence of right bundle-branch block.   Review of the CPX was a little bit hard for me to interpret.  I will  need to talk about it with you.  It noted  a couple of things.  One was  drop in blood pressure over the last 3 stages.  Two, O2 pulse increasing  throughout to 20 mm per beat.  Three, that the VO2 max adjusted for  weight was 26.3.   IMPRESSION:  1. Nonischemic cardiomyopathy.  2. IVCD as noted above.  3. Congestive heart failure.  4. Exercise-associated lightheadedness.  5. History of hypertension.   Luke Gill, Luke Gill clearly is an appropriate candidate for ICD  implantation.  We reviewed this extensively including the potential  risks.  These included but were not limited to death, perforation,  infection, lead dislodgement, device malfunction.  The other question  was is whether he might benefit from a left ventricular lead placement  given his IVCD.   This is harder for me to understand.  I will need to  discuss with you and glean from your wisdom what the implications of his  CPX are; specifically I am interested in what the potential for  remediation is implied by the loss of blood pressure with exercise in  this particular gentleman.   Given his dizziness, I have also suggested that he, in the interim and  anticipation of him seeing you on Monday, decrease his lisinopril from  40 to 10.  As I recall, you having told me in the past, the data for  incremental ACE inhibitor is not nearly as striking as it is for  incremental Coreg and given the debilitating lightheadedness which  currently is his major symptom, I thought this might be worth trying in  these next few days.   I will look forward to talking with you about him when you get back from  [place] and prior to you seeing him on Monday.    Sincerely,      Duke Salvia, MD, [facility]  Electronically Signed    SCK/MedQ  DD: 02/06/2007  DT: 02/07/2007  Job #: [id number]

## 2010-09-20 NOTE — Assessment & Plan Note (Signed)
[facility] OFFICE NOTE   WAYLYN, TENBRINK                        MRN:          [id number]  DATE:07/18/2007                            DOB:          April 24, 1951    PRIMARY CARE PHYSICIAN:  Dr. Vonita Moss.   NOTABLE HISTORY:  Luke Gill is a delightful 60 year old male with a history  of hypertension, obesity, orthostatic hypotension and congestive heart  failure secondary nonischemic cardiomyopathy.  He is status post Bi-V  ICD.  EF was originally 10-15%, now it is 35-40%.   He has been having severe problems with orthostatic hypotension.  We  have had to cut back on many of his heart failure medications.  He saw  Dr. Graciela Husbands who started him on Mestinon, but this has not helped.  He has  been following his blood pressures quite closely, and on average his  systolics are in the 160-170 range and can drop anywhere from 20-60  points with standing.  He is quite symptomatic when this happens.  He  denies any significant shortness of breath.  No chest pain or edema.  He  has noticed some benefit in his systolic pressure from spironolactone.   CURRENT MEDICATIONS:  1. Aspirin 81 mg a day.  2. Digoxin 0.25 mg a day.  3. Protonix 40 a day.  4. Flonase inhaler.  5. Zoloft 50 mg a day.  6. Lisinopril 40 a day.  7. Metoprolol 25 b.i.d.  8. Mestinon 60 b.i.d.  9. Spirolactone 12.5 a day.  10.Lisinopril 20 b.i.d.   PHYSICAL EXAMINATION:  GENERAL:  No acute distress.  Ambulates around  the clinic without any respiratory difficulty.  VITAL SIGNS:  Blood pressure is 144/80, heart rate 62, weight 266.  HEENT:  Normal.  NECK:  Supple.  There is no JVD.  Carotids are 2+ bilaterally without  bruits.  There is no lymphadenopathy or thyromegaly.  CARDIAC:  PMI is nondisplaced.  He has a regular rate and rhythm.  No  murmurs, rubs or gallops.  LUNGS:  Clear.  ABDOMEN:  Obese, nontender, nondistended hepatosplenomegaly.  No  bruits,  no masses.  Good bowel sounds.  EXTREMITIES:  Warm with no cyanosis, clubbing or edema.  He has some  scars from previous healed ulcers.  No rash.  NEUROLOGICAL:  Alert and oriented x3.  Cranial nerves II-XII intact.  Moves all four extremities without difficulty.  Affect is pleasant.   ASSESSMENT/PLAN:  1. Orthostatic hypotension.  This is quite debilitating.  At this      point, I think the only option is to try him on either midodrine or      Florinef.  With his heart failure, I prefer midodrine, but I do      worry that this may increase his resting systolic blood pressure      some.  We will start him on low dose 2.5 mg b.i.d. and see how he      responds.  He will keep close track on his blood pressures and      contact me within several days  to let me know how he is doing.  I      told him he could increase the spironolactone if he was having      problems with his blood pressure.  2. Congestive heart failure.  He is stable NYHA class II.      Unfortunately, medication titration has been limited due to his      orthostatic hypotension.  We will continue to follow.     [name], MD  Electronically Signed    DRB/MedQ  DD: 07/18/2007  DT: 07/19/2007  Job #: [id number]   cc:   Dr. Vonita Moss

## 2010-09-20 NOTE — Assessment & Plan Note (Signed)
[facility] OFFICE NOTE   Luke Gill, Luke Gill                        MRN:          [id number]  DATE:12/13/2007                            DOB:          1951-02-10    PRIMARY CARE PHYSICIAN:  Dr. Vonita Moss.   INTERVAL HISTORY:  Luke Gill is a delightful 60 year old male with a history  of hypertension, obesity, severe orthostatic hypotension, and congestive  heart failure, secondary nonischemic cardiomyopathy status post Bi-V ICD  , ejection fraction was originally 10-15% now up to 45-50%.   He returns today for routine followup.  He is doing much better.  He is  now back playing 18 holes  of golf, although he does get tired by the  end.  He has not had any chest pain and no significant dyspnea,  occasional edema.  He has noted a marked improvement in his orthostatic  hypotension with the midodrine although every once in a while he does  get lightheaded.  Current blood pressures at home are systolics around  115-138.   CURRENT MEDICATIONS:  1. Aspirin 81.  2. Digoxin 0.25 a day.  3. Protonix 40 mg a day.  4. Flonase inhaler.  5. Zoloft 50 mg a day.  6. Spironolactone 12.5 a day.  7. Lisinopril 20 b.i.d.  8. Metoprolol 25 b.i.d.  9. Midodrine 2.5 b.i.d.   PHYSICAL EXAM:  GENERAL:  He is well appearing in no acute distress.  Ambulates on the clinic without any respiratory difficulty.  VITAL SIGNS:  Blood pressure is 124/84, heart rate 56, and weight 275.  HEENT:  Normal.  NECK:  Supple.  No JVD.  Carotids are 2+ bilaterally without any bruits.  There is no lymphadenopathy or thyromegaly.  CARDIAC:  PMI is nondisplaced.  Regular rate and rhythm.  No murmurs.  No S3.  LUNGS:  Clear.  ABDOMEN:  Obese, nontender, nondistended.  No hepatosplenomegaly.  No  bruits.  No masses.  Good bowel sounds.  EXTREMITIES:  Warm with no cyanosis or clubbing.  There is trace edema  on the right.  He does have scars from  previously healed ulcers.  No  rash.  NEURO:  Alert and oriented x3.  Cranial nerves II through XII are  intact.  Moves all 4 extremities without difficulty.  Affect is  pleasant.   ASSESSMENT/PLAN:  1. Congestive heart failure.  Ejection fraction is much improved.  He      is stable NYHA class II.  Given his problems with orthostatic      hypotension, I do not want to titrate his medications anymore at      this time.  We will try and switch him over to Toprol if this is      not cost prohibitive.  2. Orthostatic hypotension much improved with midodrine.  I will keep      him on this for now.  Eventually I would like to be able to try and      wean him off if possible.  3. Lipids.  I will check his cholesterol next week.  Luke Gill. Bensimhon, MD  Electronically Signed    DRB/MedQ  DD: 12/13/2007  DT: 12/14/2007  Job #: [id number]

## 2010-09-20 NOTE — Assessment & Plan Note (Signed)
[facility] OFFICE NOTE   Luke Gill, Luke Gill                        MRN:          [id number]  DATE:04/19/2007                            DOB:          [birth year]    PRIMARY CARE PHYSICIAN:  Dr. Vonita Moss.   INTERVAL HISTORY:  Luke Gill is a delightful 60 year old male with a  history of hypertension, obesity, and congestive heart failure secondary  to nonischemic cardiomyopathy with onset in May of 2008.  Initial  ejection fraction is 10-15% by catheterization, which also showed normal  coronary arteries.  He is status post BiV ICD by Dr. Graciela Husbands.  Current EF  is about 35-40%.  This course has been complicated by severe orthostatic  hypotension and we actually changed his Coreg over to Toprol and have  cut down his lisinopril.   His last visit, I increased his Toprol from 25 to 50.  He has tolerated  this well.  He still gets dizzy if he stands quickly, but feels much,  much better, and says he feels better now than he did even before he had  his cardiomyopathy.  He is down almost 100 pounds from that point.  He  denies orthopnea.  No PND.  No lower extremity edema.   CURRENT MEDICATIONS:  1. Aspirin 81.  2. Lanoxin 250 mcg a day.  3. Protonix 40 a day.  4. AcipHex 20 a day.  5. Lisinopril 10 a day.  6. Toprol 50 at night.   PHYSICAL EXAM:  He is in no acute distress.  He ambulates around the  clinic without any respiratory difficulty.  Blood pressure 140/88 on the left while sitting.  On standing, it goes  to 108.70.  Heart rate is 84, weight is 274.  HEENT:  Normal.  NECK:  Supple.  No JVD.  Carotids are 2+ bilaterally without bruits.  There is no lymphadenopathy or thyromegaly.  CARDIAC:  PMI is mildly laterally displaced with regular rate and  rhythm.  No murmurs, rubs, or gallops.  LUNGS:  Clear.  ABDOMEN:  Soft, obese, nontender, nondistended.  No hepatosplenomegaly.  No bruits.  No masses.   Good bowel sounds.  EXTREMITIES:  Warm.  No cyanosis or clubbing.  He has compression  stockings on.  No significant edema.  He does have changes consistent  with previous chronic venous stasis and healed ulcers.  NEURO:  He is alert and oriented x3.  Cranial nerves 2-12 are intact.  Moves all 4 extremities without difficulty.  Affect is pleasant.   ASSESSMENT AND PLAN:  1. Congestive heart failure secondary to nonischemic cardiomyopathy.      He is doing well.  He is euvolemic.  We will try and get his Toprol      up to 75 a day now and see if we can get to 100.  Obviously, we      will have to be careful due to his orthostatic hypotension.  2. Orthostatic hypotension.  This was somewhat improved.  Hopefully,      his compression stockings will help him.  DISPOSITION:  Return to clinic in 1 month.  We will get an  echocardiogram in January to see how his LV is recovering.     Luke Buckles. Bensimhon, MD  Electronically Signed    DRB/MedQ  DD: 04/19/2007  DT: 04/19/2007  Job #: [id number]

## 2010-09-20 NOTE — Assessment & Plan Note (Signed)
[facility]                         GASTROENTEROLOGY OFFICE NOTE   PAYNE, GARSKE                        MRN:          [id number]  DATE:12/17/2006                            DOB:          1950-09-02    REASON FOR CONSULTATION:  Excess gas.   REASON:  Luke Gill is a pleasant 60 year old white male referred  through the courtesy of Dr. Gala Romney for evaluation. He complains of  excess gas characterized by frequent belching and hiccupping. He denies  pyrosis, nausea, sore throats, or coughing. He is without abdominal  pain, per se. He is on no gastric irritants except for baby aspirin  daily. There is no history of dysphagia.   PAST MEDICAL HISTORY:  Pertinent for ischemic cardiomyopathy. He has had  congestive heart failure in the past, as well as hypertension.   FAMILY HISTORY:  Unknown since he is adopted.   MEDICATIONS:  Include; baby aspirin, digoxin, lisinopril, Protonix,  potassium, Lasix, Coreg, and spironolactone. He has no allergies. He  does not smoke. He rarely drinks. He is married and works as a Presenter, broadcasting.   REVIEW OF SYSTEMS:  Positive for feet swelling and some shortness of  breath.   PHYSICAL EXAMINATION:  Pulse 80, blood pressure 120/76, weight 288.  HEENT: EOMI. PERRLA. Sclerae are anicteric.  Conjunctivae are pink.  NECK:  Supple without thyromegaly, adenopathy or carotid bruits.  CHEST:Clear to auscultation and percussion without adventitious sounds.  CARDIAC:Regular rhythm; normal S1 S2.  There are no murmurs, gallops or  rubs.  ABDOMEN:Bowel sounds are normoactive.  Abdomen is soft, non-tender and  non-distended.  There are no abdominal masses, tenderness, splenic  enlargement or hepatomegaly.  EXTREMITIES:  Full range of motion.  No cyanosis, clubbing or edema.  RECTAL:  Deferred.   IMPRESSION:  1. Nonspecific dyspepsia. This still could be a manifestation of      gastroesophageal reflux disease despite  his Protonix.  2. Ischemic cardiomyopathy .   RECOMMENDATION:  1. Trial of Aciphex. If symptoms have not improved, I will consider      upper endoscopy.  2. Screening colonoscopy at some point in the near future.     Barbette Hair. Arlyce Dice, MD,FACG  Electronically Signed    RDK/MedQ  DD: 12/17/2006  DT: 12/17/2006  Job #: [id number]   cc:   [name], MD

## 2010-09-20 NOTE — Letter (Signed)
December 17, 2006    [name], MD  [address], [place] [address]   RE:  [name], [name]  MRN:  [id number]  /  DOB:  07/31/1950   Dear Dr. Gala Romney:   Upon your kind referral, I had the pleasure of evaluating your patient  and I am pleased to offer my findings.  I saw Luke Gill in the  office today.  Enclosed is a copy of my progress note that details my  findings and recommendations.   Thank you for the opportunity to participate in your patient's care.    Sincerely,      Barbette Hair. Arlyce Dice, MD,FACG  Electronically Signed    RDK/MedQ  DD: 12/17/2006  DT: 12/17/2006  Job #: [id number]

## 2010-09-20 NOTE — Assessment & Plan Note (Signed)
[facility] OFFICE NOTE   ARTHA, CHIASSON                        MRN:          [id number]  DATE:11/26/2006                            DOB:          May 17, 1950    INTERVAL HISTORY:  Mr. Luke Gill is a delightful 60 year old male with a  history of hypertension, obesity, and congestive heart failure secondary  to nonischemic cardiomyopathy. This was diagnosed in May. Ejection  fraction was 10% to 15% at that time. Catheterization showed normal  coronary arteries. He also has a history of left bundle branch block. He  comes today for routine followup.   Overall he says he is doing fairly well with good days and bad days. He  continues to play golf, however the other day he had to stop after 14  holes due to running out of gas. He denies any chest pain. No orthopnea.  No PND. No lower extremity edema. He says he has had an increase in his  dizziness and orthostasis. His weight has been unchanged. No  palpitations.   CURRENT MEDICATIONS:  1. Aspirin 81.  2. Lanoxin 250 mcg a day.  3. Lisinopril 40 a day.  4. Protonix 40 a day.  5. Potassium 20 a day.  6. Lasix 40 a day.  7. Coreg 25 b.i.d.  8. Spironolactone 12.5 a day.   PHYSICAL EXAMINATION:  He is well-appearing in no acute distress. He  ambulates around the clinic without any respiratory difficultly. When he  stands up after lying down he does feel somewhat lightheaded. Blood  pressure is 114/76, when he stands up systolic goes down to about 100.  Pulse is 72, weight is 289 which is stable.  HEENT: Normal.  NECK: Supple. No JVD. Carotids are 2 + without any bruits. There is no  lymphadenopathy or thyromegaly.  CARDIAC: PMI is laterally displaced. He has a regular rate and rhythm.  No murmurs, rubs, or gallops.  LUNGS: Clear.  ABDOMEN: Obese, nontender, nondistended. No hepatosplenomegaly. No  bruits. No masses. Good bowel sounds.  EXTREMITIES: Warm  with no cyanosis, clubbing, or edema. He does have  chronic venous stasis changes but no ulcers. No rash.  NEURO: He is alert and oriented x3. Cranial nerves II-XII are intact.  Moves all 4 extremities without difficultly. Affect is pleasant.   Quick look echocardiogram performed by me showed left ventricular  ejection fraction probably about 25%. His LV dimensions seemed to have  improved somewhat though I did not measure them formally.   ASSESSMENT/PLAN:  Congestive heart failure secondary to nonischemic  cardiomyopathy. He is stable NYHA class 2 symptoms. I suspect he may be  a little bit dry and this is contributing to his orthostasis. We will  change his Lasix over to 40 mg every Monday, Wednesday, and Friday, and  just use additional doses as needed. We will check a BMET next week and  also a formal echocardiogram. Should his ejection fraction remain under  35% after 9 months, we will need to consider an ICD plus minus BiV  therapy.   DISPOSITION:  Return  to clinic in 3 weeks. There will be no change in  medication at this point except for his Lasix.     [name], MD  Electronically Signed    DRB/MedQ  DD: 11/26/2006  DT: 11/26/2006  Job #: [id number]

## 2010-09-20 NOTE — Letter (Signed)
December 17, 2006    [name]   RE:  Luke, Gill  MRN:  [id number]  /  DOB:  1950/11/11   Dear Mr. Luke Gill:   It is my pleasure to have treated you recently as a new patient in my  office.  I appreciate your confidence and the opportunity to participate  in your care.   Since I do have a busy inpatient endoscopy schedule and office schedule,  my office hours vary weekly.  I am, however, available for emergency  calls every day through my office.  If I cannot promptly meet an urgent  office appointment, another one of our gastroenterologists will be able  to assist you.   My well-trained staff are prepared to help you at all times.  For  emergencies after office hours, a physician from our gastroenterology  section is always available through my 24-hour answering service.   While you are under my care, I encourage discussion of your questions  and concerns, and I will be happy to return your calls as soon as I am  available.   Once again, I welcome you as a new patient and I look forward to a happy  and healthy relationship.    Sincerely,      Barbette Hair. Arlyce Dice, MD,FACG  Electronically Signed   RDK/MedQ  DD: 12/17/2006  DT: 12/17/2006  Job #: [id number]

## 2010-09-20 NOTE — Assessment & Plan Note (Signed)
[facility] OFFICE NOTE   MARV, ALFREY                        MRN:          [id number]  DATE:06/07/2007                            DOB:          05-Apr-1951    PRIMARY CARE PHYSICIAN:  Dr. Tama Gander.   INTERVAL HISTORY:  Mr. Rendleman is a delightful 60 year old male with a  history of hypertension, obesity, orthostatic hypotension, and  congestive heart failure secondary nonischemic cardiomyopathy who  returns today for routine follow-up.  He is status post BiV ICD by Dr.  Graciela Husbands.  EF is improved from 10-15% is now the 35-40% range.   He returns today for routine follow-up.  Overall he says he is feeling  okay but continues to have problems with orthostatic symptoms.  He also  notes that after he had a heavy salt meal a couple weeks ago.  He really  felt sluggish for few days.  He denies any orthopnea, no PND.  No lower  extremity edema.  He also has noted that his mood has been down a little  bit and feels somewhat depressed.   He had an echocardiogram about a week ago which showed an EF of  persistent that 35-40%.  However, his left ventricular dimensions have  continued to come down with a left ventricular end-diastolic internal  dimension of 47 mm.  This was previously 54.   CURRENT MEDICATIONS:  Aspirin 81 a day, Protonix 40 a day, Aciphex 20 a  day, lisinopril 10 a day and Toprol 75 a day.  Lasix p.r.n.   PHYSICAL EXAM:  He was in no acute distress.  Ambulates around the  clinic without respiratory difficulty.  Blood pressure is 165/100, heart rate 76 weight 273.  HEENT is normal.  Neck is supple.  No obvious JVD.  Carotids are 2+ bilateral without  bruits.  There is no lymphadenopathy or thyromegaly.  CARDIAC:  PMI is nondisplaced.  He is regular.  No murmurs, rubs or  gallops.  Abdomen is obese, nontender, nondistended.  No obvious  hepatosplenomegaly.  No bruits, no masses.  Extremities  are warm with no cyanosis, clubbing or edema.  He does have  scars from previous healed ulcers.  No rash.  NEURO:  He is alert and x3.  Cranial nerves II-XII are intact.  Moves  all 4 extremities without difficulty.  Affect is pleasant.   ASSESSMENT/PLAN:  1. Congestive heart failure secondary nonischemic cardiomyopathy.  He      is stable.  Ejection fraction is unchanged.  He is New York Heart      Association class II to III.  Previous titration of his medication      has been limited by severe orthostatic hypotension.  Will try to      goal up on his Toprol to 100 mg a day.  He was unable tolerate      Coreg in the past.  We will also like to go up on his ACE inhibitor      in the near future.  2. Hypertension.  This is quite severe but  unfortunately complicated      by orthostasis.  As above, we will go up on his Toprol.  I am      wondering if he may have a component of autonomic insufficiency and      he may benefit from an SSRI especially in the face of his      depression.  Will start him on Zoloft 25 mg a day and titrate to      50.  3. Depression as above.  Start Zoloft.  4. Sinus congestion.  We will start him on Flonase.  If his symptoms      persist, he will go see ENT.   DISPOSITION:  Will see him back in several weeks for routine follow-up.  He will keep a blood pressure log for [place].     [name], MD  Electronically Signed    DRB/MedQ  DD: 06/07/2007  DT: 06/07/2007  Job #: [id number]

## 2010-09-20 NOTE — Assessment & Plan Note (Signed)
[name] OFFICE NOTE   Luke Gill, Luke Gill                        MRN:          [id number]  DATE:03/11/2007                            DOB:          1950-11-24    INTERVAL HISTORY:  Luke Gill is a delightful 60 year old male with a  history of hypertension, obesity, and congestive heart failure secondary  to nonischemic cardiomyopathy in mid May.  Initial ejection fraction was  10% to 15% by cardiac catheterization, which also showed normal coronary  arteries.  He also has a left bundle branch block.  His course has been  complicated by severe orthostatic hypotension.   He recently underwent placement of a Bi-V ICD by Dr. Graciela Husbands.  He also  underwent optimization.  His optimization echocardiogram showed an EF of  35% to 40%.   He returns today for routine followup.  He denies any chest pain or  shortness of breath.  His main problems continues to be severe  orthostatic hypotension to the point where he gets quite dizzy.  He has  almost passed out once or twice.  This only happens when he stands.  It  does not happen at rest.  He has not had any problems with orthopnea or  lower extremity edema.  No PND.   CURRENT MEDICATIONS:  1. Aspirin 81 a day.  2. Lanoxin 250 a day.  3. Protonix 40 a day.  4. Aciphex, which he is out of.  5. Spironolactone 25 a day.  6. Coreg 12.5 b.i.d.  7. Lisinopril 10 a day.   PHYSICAL EXAMINATION:  He is in no acute distress.  He ambulates around  the clinic without any respiratory difficulty.  Blood pressure is 100/64.  His pulse is  80.  Weight is 274.  HEENT:  Normal.  NECK:  Supple.  There is no JVD.  Carotids are 2+ bilaterally without  any bruits.  There is no lymphadenopathy or thyromegaly.  CHEST WALL:  His pacemaker site looks good.  PMI is mildly loudly displaced.  He has a regular rate and rhythm.  No  murmurs, rubs, or gallops.  LUNGS:  Clear.  ABDOMEN:  Soft,  nontender, and non-distended.  No hepatosplenomegaly.  No bruits.  No masses.  Good bowel sounds.  EXTREMITIES:  Warm with no cyanosis, clubbing, or edema.  He does have skin changes consistent with chronic venostasis, and healed  ulcers.  NEUROLOGIC:  He is alert and oriented x3.  Cranial nerves 2 through 12  are intact.  Moves all 4 extremities without difficulty.  Affect is very  pleasant.   ASSESSMENT AND PLAN:  1. Congestive heart failure secondary to nonischemic cardiomyopathy      and systolic dysfunction.  He is New York Heart Association Class      II to III.  Volume status looks good.  Given his orthostatic      hypotension, we will switch him over from Coreg to Toprol 50 mg      b.i.d. to try reduce his alpha effect.  2. Orthostatic hypotension.  As above, we are  switching his Coreg to      Toprol.  We will put compression stockings on him.  I have had him      liberalize his salt.  If this does not have any significant      benefit, we can consider discontinuing his spironolactone.  3. Gastroesophageal reflux disease.  He will follow up with Dr.      Arlyce Dice.   DISPOSITION:  He will return to clinic in 2 weeks for further  evaluation.     [name], MD  Electronically Signed    DRB/MedQ  DD: 03/11/2007  DT: 03/11/2007  Job #: [id number]

## 2010-09-20 NOTE — Assessment & Plan Note (Signed)
[facility] OFFICE NOTE   DORAL, VENTRELLA                        MRN:          [id number]  DATE:12/19/2006                            DOB:          [birth year]    INTERVAL HISTORY:  Luke Gill is a delightful 60 year old male with a  history of hypertension, obesity and congestive heart failure secondary  to a nonischemic cardiomyopathy.  This was diagnosed in May.  Ejection  fraction was 10-15% at the time.  Catheterization showed normal  coronaries.  He also has a left bundle branch block.  We did repeat his  echocardiogram in July and it showed his EF is now about 20-25%, which  is only minimally better; however, his left ventricle appears to have  done some significant reverse remodeling.  His previous diastolic  diameter of the LV was 7.5 cm and now it is 5.7 cm.   Symptomatically he is doing quite well with NYHA class II-III symptoms.  He actually took his Lasix every day last week by mistake instead of  every other day and felt quite lightheaded and run down.  Since he has  corrected the problem, he has felt back to baseline.  No orthopnea, PND  or lower extremity edema.   CURRENT MEDICATIONS:  1. Aspirin 81 mg a day.  2. Lanoxin 250 mcg a day.  3. Lisinopril 40 mg a day.  4. Protonix 40 mg a day.  5. Coreg 25 mg b.i.d.  6. Spironolactone 12.5 mg daily.  7. Lasix 40 mg Monday, Wednesday, Friday.  8. Potassium 20 mEq Monday, Wednesday, Friday.  9. Aciphex 20 mg a day.   PHYSICAL EXAMINATION:  He is well-appearing, in no acute distress.  Ambulates around the clinic without any respiratory difficulty.  He does  appear to have some indigestion, for which he recently saw Dr. Arlyce Dice.  Blood pressure 116/78, heart rate 72, weight 281.  HEENT:  His neck is supple.  There is no JVD.  Carotids are 2+  bilaterally, no bruit.  LUNGS:  Clear.  CARDIAC:  PMI is not palpable.  There is a regular rate and  rhythm.  No  murmurs, rubs or gallops.  There is no S3 today.  ABDOMEN:  Obese, nontender, nondistended.  No hepatosplenomegaly, no  bruits, no masses, good bowel sounds.  EXTREMITIES:  Warm with healed ulcers.  No active ulcers.  There is no  edema, no cyanosis or clubbing, no rash.  NEUROLOGIC:  Alert and oriented x2.  Cranial nerves II-XII are intact.  He moves all four extremities without difficulty.   ASSESSMENT AND PLAN:  1. Congestive heart failure secondary to nonischemic cardiomyopathy.      He is doing quite well, as above NYHA class II-III symptoms.      Volume status looks good.  I told him he can decrease Lasix further      as needed.  Our plan will be to check an echocardiogram again in      one month to see if his ejection fraction will continue to recover.  Although his recovery has been slower than I would have hoped, he      does have evidence of some reverse remodeling, which is somewhat      promising.  Should his EF remain stable under 35%, he will      obviously need evaluation for a defibrillator and possibly      resynchronization therapy with a biventricular device given his      left bundle branch block.  2. Indigestion.  This is being followed by Dr. Arlyce Dice.  He was      recently started on a PPI.   DISPOSITION:  Return to clinic in one month.   Of note, we will stop his potassium and increase spironolactone to 25 mg  a day.     [name], MD  Electronically Signed    DRB/MedQ  DD: 12/19/2006  DT: 12/20/2006  Job #: [id number]

## 2010-09-20 NOTE — Assessment & Plan Note (Signed)
[facility]                            CARDIOLOGY OFFICE NOTE   DARSHAWN, BOATENG                        MRN:          [id number]  DATE:10/31/2006                            DOB:          [birth year]    PRIMARY:  Luke Gill , M.D.   CARDIOLOGIST:  [name], M.D.   REASON FOR PRESENTATION:  Evaluate the patient with lower extremity  edema.   HISTORY OF PRESENT ILLNESS:  The patient is a pleasant 60 year old  gentleman with nonischemic cardiomyopathy.  This diagnosis is relatively  new to him.  He has been weighing himself every day and vigilantly  paying attention to volume management.  Recently, he had his dose of  diuretic decreased because he was probably hypovolemic.  He noticed  today and yesterday some increased swelling slightly greater on the  right than the left.  He was up 2 pounds.  He called this morning to be  added on.  He was instructed to take an extra 40 mg of Lasix, which he  did.   He is not having any new shortness of breath.  He is not having any PND  or orthopnea.  He has not been having any palpitations, pre-syncope, or  syncope.  He denies any chest pain.  He has been having fatigue.  This  seems to be intermittent and he has good days and bad days.   PAST MEDICAL HISTORY:  Nonischemic cardiomyopathy (EF approximately 20-  25% most recently).   ALLERGIES:  None.   MEDICATIONS:  1. Aspirin 81 mg daily.  2. Lanoxin 250 mcg daily.  3. Lisinopril 40 mg daily.  4. Protonix.  5. K-Dur 20 mEq daily.  6. Coreg 18.75 mg b.i.d.  7. Lasix 40 mg daily.   REVIEW OF SYSTEMS:  As stated in the HPI and otherwise negative for  other systems.   PHYSICAL EXAMINATION:  The patient is in no distress.  Blood pressure 128/78, heart rate 78 and regular, weight 291 pounds.  HEENT:  Eyelids unremarkable.  Pupils are equal, round, and reactive to  light and accommodation.  Fundi not visualized.  Oral mucosa  unremarkable.  NECK:  No jugular venous distension at 45 degrees, carotid upstroke  brisk and symmetric, no bruits, thyromegaly.  LYMPHATICS:  No cervical, axillary, or inguinal adenopathy.  LUNGS:  Clear to auscultation bilaterally.  BACK:  No costovertebral angle tenderness.  CHEST:  Unremarkable.  HEART:  PMI not displaced or sustained, S1 and S2 within normal limits,  no S3, no S4, no clicks, no rubs, no murmurs.  ABDOMEN:  Obese, positive bowel sounds, normal in frequency and pitch,  no bruits, rebound, guarding.  No midline pulsatile mass, hepatomegaly,  splenomegaly.  SKIN:  No rashes, no nodules.  EXTREMITIES:  With 2+ pulses throughout, no edema, cyanosis, clubbing.  NEURO:  Grossly intact throughout.   ASSESSMENT AND PLAN:  1. Cardiomyopathy.  The patient is concerned because of his increased      weight and some mild lower extremity swelling.  I am going to tell  him to take an extra 40 mg of Lasix again tomorrow.  He will then      go back to the 40 mg once a day.  He will take potassium today and      tomorrow to accompany the increased dose of Lasix.  We had a long      discussion about how to dose adjust his Lasix.  He is going to do      this gingerly at first, calling us often and getting blood work      probably frequently.  We talked about salt restriction and fluid      restriction.  He has been educated well by Dr. Gala Romney.  He will      otherwise continue on the meds as listed.  I will not up-titrate      because of recent hypotension.  2. He is going to be considered for a sleep study.  I will defer to      Dr. Gala Romney.  3. Followup.  He has an appointment early next week with Dr.      Gala Romney.     Rollene Rotunda, MD, [facility]  Electronically Signed    JH/MedQ  DD: 10/31/2006  DT: 10/31/2006  Job #: [id number]   cc:   Luke Lofts, MD

## 2010-09-20 NOTE — Assessment & Plan Note (Signed)
[facility] OFFICE NOTE   SUHAN, PACI                        MRN:          [id number]  DATE:10/02/2006                            DOB:          22-Jul-1950    PATIENT IDENTIFICATION:  Luke Gill is a delightful 60 year old male  with a history of hypertension, morbid obesity and chronic lower  extremity edema, who was recently admitted to [facility]  with acute heart failure.  Echocardiogram showed an EF of 10-15%.  He  underwent cardiac catheterization, which showed normal coronary  arteries.  He also had a history of left bundle branch block.  He  returns today for routine follow-up.   Overall he is doing very well.  He is being compliant with all his  medications and watching his fluid intake religiously.  He says he feels  better than he has in a long time.  He even was able to play 9 holes of  golf yesterday without any difficulty.  He did get a little dizzy  bending over at the end of the 9 holes.  He denies any lower extremity  edema.  His leg ulcers are healing.  No orthopnea, no PND, no chest  pain.  His wife states that he is no longer snoring and he is sleeping  as quietly as she has seen him sleep in a long time.   CURRENT MEDICATIONS:  1. Aspirin 81 mg a day.  2. Coreg 12.5 mg b.i.d.  3. Lasix 40 mg b.i.d.  4. Lanoxin 0.25 mg per day.  5. Lisinopril 40 mg per day.  6. Potassium 20 mEq b.i.d.  7. Protonix 40 mg a day.   PHYSICAL EXAMINATION:  GENERAL:  He is well-appearing, in no acute  distress.  He ambulates around the clinic without any respiratory  difficulty.  VITAL SIGNS:  Blood pressure is 110/72, heart rate 74, weight is 296,  which is down about 40 pounds since he was in the hospital.  HEENT:  Normal.  NECK:  Supple.  JVP is about 6 cmH2O.  Carotids are 2+ bilaterally with  no bruits.  There is no lymphadenopathy or thyromegaly.  CARDIAC:  PMI is laterally  displaced.  He has regular rate and rhythm,  no obvious murmurs, no S3.  LUNGS:  Clear.  ABDOMEN:  Obese, nontender, nondistended, no hepatosplenomegaly, no  bruit, no masses.  Good bowel sounds.  EXTREMITIES:  Warm with no cyanosis or clubbing.  He does have changes  consistent with previous lower extremity ulcers as well as 1 or 2 small  scabs on his right lower extremity.  There is no edema.  NEUROLOGIC:  Alert and oriented x3.  Cranial nerves II-XII are intact.  Moves all four extremities without difficulty.  Affect is pleasant.   EKG shows normal sinus rhythm at a rate of 74 with a left bundle branch  block.   A quick-look echocardiogram, which I did this morning, shows mildly  dilated LV.  EF is probably around 15-20%.   ASSESSMENT AND PLAN:  1. Congestive heart failure secondary to  severe left ventricular      dysfunction in the setting of a nonischemic cardiomyopathy.      Functionally he is doing quite well, NYHA Class II symptoms,      although he is yet to see any significant recovery in his ejection      fraction, but it is likely too soon.  I will continue his      medications with titrating his Coreg up to 18.75 mg b.i.d.      Eventually we will need to get him on spironolactone as well as      possibly a statin.  I will see him back in clinic in 2 weeks.  We      will check a BMET and a BNP today to make sure his renal function      is stable.  2. Hypertension.  This is well-controlled.  3. Obstructive sleep apnea.  This seems to have improved greatly with      treatment of his heart failure.  He will eventually need a sleep      study.   DISPOSITION:  I will see him back in 2 weeks for a routine follow-up.  I  did tell him that if increasing his Coreg worsens his heart failure  symptoms that he can go ahead and take some extra Lasix as needed.     [name], MD  Electronically Signed    DRB/MedQ  DD: 10/02/2006  DT: 10/02/2006  Job #: [id number]   cc:    Vonita Moss, M.D.

## 2010-09-20 NOTE — Progress Notes (Signed)
[facility] OFFICE NOTE   Luke, Gill                        MRN:          [id number]  DATE:04/27/2008                            DOB:          [birth year]    Luke Gill is seen in followup for a CRT implanted for nonischemic  cardiomyopathy.  His ejection fraction has intercurrently improved  markedly up to about 45% or 50%.   His other major limitation was orthostatic hypotension which is also  much improved.  The Mestinon trial last spring was unsuccessful.  He has  remained on low dose midodrine.   PHYSICAL EXAMINATION:  His blood pressures at home have been running  relatively high and today he comes in with 118/115 with repeat of  173/101, this after having driven to the [facility].  He had been  told to come here.  His pulse was 70.  His lungs were clear.  Heart  sounds were regular.  The abdomen was protuberant.  The extremities had  1+ edema.   I should note that his weight was also 288 which is up about 9 pounds  since 6 weeks ago.   Interrogation of [employer] ICD demonstrates a P-wave of 3.1 with  impedance of 660, threshold of 0.75 at 0.5 in both chambers.  The RV  amplitude was 10.2, impedance of 440, and the LV impedance was 490 with  a threshold of 1.25 at 0.5.  Battery voltage was 3.15.  He is 91%  ventricularly pacing.  No intercurrent therapies.   IMPRESSION:  1. Nonischemic cardiomyopathy.  2. Congestive heart failure, now class I-II.  3. Status post CRTD for nonischemic cardiomyopathy, giving life to      congestive heart failure with intercurrent improvement in ejection      fraction.  4. Hypercalcemia evaluation ongoing.  5. Orthostatic intolerance.  6. Systolic hypertension.   Luke Gill is doing well from a heart failure point of view.  At this  point, we will discontinue his midodrine.  I have asked him to put 6-  inch block beneath the head of his  bed.   We will see him in one year's time.     Duke Salvia, MD, [facility]  Electronically Signed    SCK/MedQ  DD: 04/27/2008  DT: 04/28/2008  Job #: [id number]

## 2010-09-20 NOTE — Assessment & Plan Note (Signed)
[facility] OFFICE NOTE   KEYANTE, DURIO                        MRN:          [id number]  DATE:01/18/2007                            DOB:          1950/06/14    INTERVAL HISTORY:  Mr. Luke Gill is a delightful 60 year old male with a  history of hypertension, obesity, and congestive heart failure secondary  to nonischemic cardiomyopathy which was diagnosed in mid May.  Initial  ejection fraction was 10-15% with a cardiac catheterization showing  normal coronaries.  He also has a left bundle branch block.   Since we last saw him, he has continued to have problems with presyncope  and lightheadedness to the point where he could not even do his daily  activities.  We cut back on his Coreg to 12.5 twice a day and he is  feeling much better, though he still is unable to do many of the  activities he was able to do just a few weeks ago.  He has stopped  playing golf.  He denies any orthopnea, PND, no lower extremity edema,  no chest pain.  He has stopped his Lasix completely.   CURRENT MEDICATIONS:  1. Aspirin 81 mg a day.  2. Digoxin 250 mcg a day.  3. Lisinopril 40 a day.  4. Protonix 40 a day.  5. Aciphex 20 a day.  6. Spironolactone 25 a day.  7. Coreg 12.5 mg b.i.d.  8. Lasix p.r.n. only.  9. Potassium p.r.n. only.   PHYSICAL EXAMINATION:  GENERAL:  He is well-appearing in no acute  distress.  He continues to have a lot of reflux and hiccup.  He  ambulates around the clinic without any respiratory difficulty.  VITAL SIGNS:  Blood pressure is 122/78, heart rate 72, weight is 281  which is very stable.  HEENT:  Normal.  NECK:  Supple.  No  JVD.  Carotids are 2 plus bilaterally without any  bruits.  There is no lymphadenopathy or thyromegaly.  CARDIAC:  PMI is not palpable.  He is regular with no murmur.  Question  soft S3.  LUNGS:  Clear.  ABDOMEN:  Obese, nontender, nondistended.  No  hepatosplenomegaly.  No  bruits.  No masses.  Good bowel sounds.  EXTREMITIES:  Warm with no cyanosis, clubbing, or edema.  He does have  changes consistent with chronic venous stasis and a healed ulcer.  NEUROLOGIC: Alert and oriented  x3.  Cranial nerves II-XII are intact.  He moves all 4 extremities without difficulty.   ACCESSORY DATA:  EKG shows a normal sinus rhythm at a rate of 69, with a  nonspecific IVCD.  Currently his forces are positive in V1 and V2 but  previously they have been negative with a more definitive left bundle.   LABORATORY:  Sodium is 136, potassium 4.8, BUN of 12, creatinine of 1.3  which is up from 1.1.  BNP is 11.   Echocardiogram read by Dr. Myrtis Ser from September 9, shows an ejection  fraction of 30%.  No significant valvular abnormalities.   ASSESSMENT/PLAN:  Congestive heart failure  secondary to nonischemic  cardiomyopathy.  Despite some recovery in his ejection fraction, his  functional capacity is clearly getting worse.  He is now class III to  IIIB in the setting of euvolemia.  His kidney function has gotten  slightly worse.  At this point, we will proceed with a cardiopulmonary  exercise test to clearly evaluate his functional capacity.  We are also  going to send him to Dr. Graciela Husbands to evaluate him for a possible BiV ICD.  I will not make any medication changes today.  We had a long talk about  the relative merits and risks of defibrillators and he agrees to  proceed.     [name], MD  Electronically Signed    DRB/MedQ  DD: 01/18/2007  DT: 01/18/2007  Job #: [id number]

## 2010-09-20 NOTE — Assessment & Plan Note (Signed)
[facility] OFFICE NOTE   BERTHEL, BAGNALL                        MRN:          [id number]  DATE:11/05/2006                            DOB:          06-Jun-1950    INTERVAL HISTORY:  Mr. Luke Gill is a delightful 60 year old male with a  history of hypertension, obesity, and congestive heart failure secondary  to a nonischemic cardiomyopathy.  He also has baseline left bundle-  branch block.  His ejection fraction is in the 20% range.  His  cardiomyopathy was diagnosed in mid May.  He returns today for routine  followup.   Earlier in the week, he was seen in the office by Dr. Antoine Poche for some  mild fluid overload.  He took an extra dose of Lasix, and it resolved.  He continues to do quite well.  He is up and around.  He plays golf on a  regular basis, although he also finds himself getting lightheaded if he  plays when it is very hot.  He has been careful to adequately replace  his fluid.  He denies any chest pain, no syncope, or presyncope.  Lower  extremity edema has been well controlled.  He is very vigilant about  watching his intake.  He continues to go to the congestive heart failure  clinic for educational sessions.   He does continue to have significant problems with indigestion and  frequent belching.  There is no abdominal pain.   CURRENT MEDICATIONS:  1. Aspirin 81 daily.  2. Lanoxin 250 daily.  3. Lisinopril 40 daily.  4. Protonix 40 daily.  5. Potassium 20 daily.  6. Coreg 18.75 b.i.d.  7. Lasix 40 daily.   PHYSICAL EXAMINATION:  GENERAL:  He is ambulatory in the clinic without  any respiratory difficulty.  He is in no acute distress.  VITAL SIGNS:  Blood pressure today is 135/84 with a heart rate of 80.  Weight is 289.  HEENT:  Normal.  NECK:  Supple.  JVP is flat.  Carotids are 2+ bilaterally without any  bruits.  There is no lymphadenopathy or thyromegaly.  CARDIAC:  PMI is mildly  displaced.  He has a regular rate and rhythm.  No murmurs, rubs, or gallops.  LUNGS:  Clear.  ABDOMEN:  Obese, nontender, nondistended.  No hepatosplenomegaly.  No  bruits.  No masses appreciated.  Good bowel sounds.  EXTREMITIES:  Warm with no cyanosis, clubbing, or edema.  He does have  chronic venostasis changes.  No rash.  NEUROLOGIC:  Alert and oriented x3.  Cranial nerves 2-12 grossly intact.  Moves all 4 extremities without difficulty.  Affect is pleasant.   ASSESSMENT/PLAN:  1. Congestive heart failure secondary to a nonischemic cardiomyopathy.      He is stable, [place] Heart Association Class II.  Volume status      looks.  We will titrate his Coreg up to 25 mg b.i.d. and add      Spironolactone 12.5 mg daily.  I did warm him about potential      transient worsening of his fluid  status and heart failure in the      setting of beta blocker titration.  He will watch closely for this.      We will check a BMET on Thursday and next week to make sure his      potassium is stable on the Spironolactone.  I will see him back in      3 weeks.  He will need an echocardiogram in late July or early      August to reassess his ejection fraction formally.  2. Gastrointestinal upset and belching.  This may be due to reflux.      He is currently on Protonix.  I made a referral for him to go see      the GI clinic.     [name], MD  Electronically Signed    DRB/MedQ  DD: 11/05/2006  DT: 11/05/2006  Job #: [id number]   cc:   [facility]

## 2010-09-20 NOTE — Assessment & Plan Note (Signed)
[facility] OFFICE NOTE   TYGE, SOMERS                        MRN:          [id number]  DATE:06/25/2007                            DOB:          1950-06-28    PRIMARY CARE PHYSICIAN:  Dr. Vonita Moss.   INTERVAL HISTORY:  Mr. Luke Gill is a delightful 60 year old male with a  history of hypertension, obesity, orthostatic hypotension and congestive  heart failure secondary to nonischemic cardiomyopathy.  He returns today  for routine follow-up.  He is status post Bi-V ICD.  EF has improved  from 10-15% to 35-40%.   He returns today for routine follow-up.  He saw Dr. Graciela Husbands recently, who  started him on Mestinon for his orthostatic hypotension.  He increased  his ACE inhibitor and also cut back his beta-blocker due to fatigue.  Overall he says he feels a bit better.  He really has not had any  significant lightheadedness or dizziness.  There is no shortness of  breath or edema.  He is very concerned that his systolic blood pressure  is often over 180.  He has not had any focal neurologic symptoms.   CURRENT MEDICATIONS:  1. Aspirin 81 mg a day.  2. Lanoxin 0.25 mg a day.  3. Protonix 40 mg a day.  4. Aciphex 20 mg a day.  5. Flonase.  6. Zoloft 50 mg a day.  7. Lisinopril 40 mg at night.  8. Metoprolol short-acting 50 mg once a day.  9. Mestinon 60 mg  b.i.d.   PHYSICAL EXAM:  He is no acute distress.  He ambulates around the Clinic  without any respiratory difficulty.  Blood pressure on sitting down was  systolic 180-190/100.  On standing up he dropped to 136/84.  Heart rate  is 60, weight 269.  HEENT:  Normal.  NECK:  Supple.  No JVD, carotids are 2+ bilaterally without bruits.  There is no lymphadenopathy or thyromegaly.  CARDIAC:  PMI is not displaced; irregular.  No murmurs, rubs or gallops.  ABDOMEN:  Obese, nontender and nondistended.  No obvious  hepatosplenomegaly.  No bruits, no masses.   Good bowel sounds.  EXTREMITIES:  Warm; with no cyanosis, clubbing or edema.  He does have  some scars from previous healed ulcers.  No rash.  NEUROLOGIC:  He is alert and oriented x3.  Cranial nerves II-XII are  intact.  He moves all four extremities without difficulty.  Affect is  pleasant.   ASSESSMENT/PLAN:  1. Congestive Heart Failure:  Secondary to nonischemic cardiomyopathy.      He is stable.  Ejection fraction is unchanged;  NYJ Class II.      Medication titration is limited by severe orthostatic hypotension.  2. Hypertension:  This is quite severe but unfortunately complicated      by orthostasis.  He has been started on Mestinon.  I am not sure if      this is helping or not.  I do know that I would like to get his      resting blood pressures down a little  bit.  We will increase his      lisinopril to 40 mg b.i.d..  He will take sitting and standing      blood pressures over the next few weeks.  If he has persistent      hypertension over 180, we will need to continue to aggressively      adjust.  I am wondering whether or not midodrine may be helpful in      this situation.  I will discuss with Dr. Graciela Husbands.  He is currently on      an SSRI.   DISPOSITION:  I will see him back in one month for routine follow-up.  He will get a BMET the next week to make sure his potassium is stable on  increased dose of lisinopril.     [name], MD  Electronically Signed    DRB/MedQ  DD: 06/25/2007  DT: 06/26/2007  Job #: [id number]

## 2010-09-20 NOTE — Assessment & Plan Note (Signed)
[facility] OFFICE NOTE   Luke Gill, Luke Gill                        MRN:          [id number]  DATE:03/19/2008                            DOB:          [birth year]    PRIMARY CARE PHYSICIAN:  Steele Sizer, MD   INTERVAL HISTORY:  Luke Gill is a delightful 60 year old male with a history  of hypertension, obesity, severe orthostatic hypotension, and congestive  heart failure secondary to nonischemic cardiomyopathy.  He is status  post BiV ICD, ejection fraction was originally 10-15%, on the most  recent check it was 45-50%.   He returns today for routine followup.  He is doing great.  [place] 18 holes of golf every day without any  difficulty.  No chest pain.  No dyspnea.  He does get minimal amount of  edema.  His orthostatic hypotension has been well controlled on  midodrine.  He does get a little bit dizzy if he gets up too quickly,  but otherwise is doing well.   CURRENT MEDICATIONS:  1. Aspirin 81.  2. Digoxin 0.25 a day.  3. Protonix 40 a day.  4. Flonase.  5. Zoloft 50 a day.  6. Spironolactone 12.5 a day.  7. Lisinopril 20 b.i.d.  8. Toprol 50 a day.  9. Midodrine 2.5 b.i.d.  10.Fish oil 1 g b.i.d.   PHYSICAL EXAMINATION:  GENERAL:  He is well appearing in no acute  distress.  Ambulates around the clinic without any respiratory  difficulty.  VITAL SIGNS:  Blood pressure is 152/96, heart rate 75, weight is 279.  HEENT:  Normal.  NECK:  Supple.  No JVD.  Carotids are 2+ bilaterally without any bruits.  There is no lymphadenopathy or thyromegaly.  CARDIAC:  PMI is nondisplaced.  Regular rate and rhythm.  No murmurs.  No S3.  LUNGS:  Clear.  ABDOMEN:  Obese, nontender, nondistended.  No hepatosplenomegaly.  No  bruits.  No masses.  Good bowel sounds.  EXTREMITIES:  Warm with no cyanosis, clubbing, or edema.  He does have  some scars from previously healed ulcers.   No rash.  NEUROLOGIC:  Alert and oriented x3.  Cranial nerves II through XII are  intact.  Moves all 4 extremities without difficulty.  Affect is  pleasant.   EKG shows sinus rhythm with V pacing.   ASSESSMENT AND PLAN:  1. Congestive heart failure secondary to nonischemic cardiomyopathy.      He is doing great.  New York Heart Association functional class I      to II.  Volume status looks good.  We will go ahead and increase      his Toprol to 50 b.i.d.  2. Orthostatic hypotension.  This is improved, controlled.  He is      mildly hypertensive here, but his home blood pressures have been      better.  We are increasing his Toprol gently.  3. Hyperlipidemia.  He is due for repeat lipid panel.  We will have      this  checked.  4. Hypercalcemia.  He has been followed by Hem/Onc as well as      Endocrine.  It seems like he may a have parathyroid problem.  He      just underwent a scan, and we hope to get the results soon.     [name], MD  Electronically Signed    DRB/MedQ  DD: 03/19/2008  DT: 03/19/2008  Job #: [id number]   cc:   Steele Sizer, MD

## 2010-09-20 NOTE — Assessment & Plan Note (Signed)
[facility] OFFICE NOTE   Luke Gill, Luke Gill                        MRN:          [id number]  DATE:10/18/2006                            DOB:          July 29, 1950    INTERVAL HISTORY:  Luke Gill is a delightful 60 year old male with a  history of hypertension, morbid obesity, and chronic lower extremity  edema; recently developed a nonischemic cardiomyopathy.  EF was 10% -  15%, he also has a history of a left bundle branch block.  He returns  today for routine followup.  Overall he is doing well though he does  note that when he gets up too quickly or bends over he dose feel very  lightheaded and he has not had any syncope, no palpitations, no chest  pain, no lower extremity edema.  He has been very careful about  monitoring his fluid intake and his weight.   CURRENT MEDICATIONS:  1. Aspirin 81 a day.  2. Lanoxin 250 mcg a day.  3. Lisinopril 40 a day.  4. Protonix 40 a day.  5. Potassium 20 a day.  6. Coreg 18.75 b.i.d.  7. Lasix 40 alternating with 80 every day.   PHYSICAL EXAMINATION:  He is in no acute distress.  Ambulates around the  clinic without any respiratory difficulty.  Blood pressure is 94/65,  heart rate 68, his weight is 294 which is down 2 pounds (this is down  probably about 50 pounds) from when I initially met him a month ago.  HEENT:  Normal.  NECK:  Supple, JVP is flat, carotids are 2+ bilaterally without any  bruits, there is no lymphadenopathy or thyromegaly.  CARDIAC:  He has a regular rate and rhythm, no obvious murmur, no  gallop.  LUNGS:  Clear.  ABDOMEN:  Obese, nontender, nondistended, no hepatosplenomegaly, no  bruits, no masses appreciated, good bowel sounds.  EXTREMITIES:  Mildly clammy but warm.  No cyanosis or clubbing, he has  chronic venous stasis changes with some small open wounds where he has  scratched.  NEURO:  Alert and oriented x3, cranial nerves II through  XII are intact,  moves all 4 extremities without difficulty.  Affect is pleasant.   A quick look echocardiogram today at the bedside were at difficult  windows but appeared that his LV may have improved slightly.  His  ejection fraction looked to be about 20% or 25%.   ASSESSMENT/PLAN:  Chronic systolic heart failure due to nonischemic  cardiomyopathy.  Ejection fraction appears to be minimally improved  today on echocardiogram, obviously it is too early to tell for sure.  He  does appear to be over diuresed.  We will hold his Lasix for 2 days and  then cut him back to 40 once a day.  We will not make any other changes  in his medications at this time.  We will see him back in 2 weeks at  which point I hope to go up on his Coreg and  perhaps get him on some spironolactone as well.  We will check routine  labs tomorrow to make sure his kidney function and potassium are stable.     [name], MD  Electronically Signed    DRB/MedQ  DD: 10/18/2006  DT: 10/18/2006  Job #: [id number]

## 2010-09-20 NOTE — Op Note (Signed)
NAMEELDRIGE, PITKIN NO.:  [id number]   MEDICAL RECORD NO.:  [id number]          PATIENT TYPE:  INP   LOCATION:  3740                         FACILITY:  [facility]   PHYSICIAN:  Duke Salvia, MD, FACCDATE OF BIRTH:  1951-01-09   DATE OF PROCEDURE:  02/21/2007  DATE OF DISCHARGE:                               OPERATIVE REPORT   PREOPERATIVE DIAGNOSES:  Congestive heart failure, left bundle-branch-  block and nonischemic cardiomyopathy.   POSTOPERATIVE DIAGNOSES:  Congestive heart failure, left bundle-branch-  block and nonischemic cardiomyopathy.   PROCEDURE:  Dual-chamber defibrillator implantation with left  ventricular lead placement and intraoperative defibrillation threshold  testing.   Following obtaining informed consent, the patient was brought to the  electrophysiology laboratory and placed on the fluoroscopic table in  supine position.  After routine prep and drape of the left upper chest,  lidocaine was infiltrated in prepectoral subclavicular region.  Incision  was made and carried down to layer of the prepectoral fascia using  electrocautery and sharp dissection.   A pocket had been performed and attention was turned gaining access to  extrathoracic left subclavian vein which was accomplished without  difficulty without the aspiration of air or puncture of the artery.  Three separate venipunctures were accomplished.  Guidewires were placed  and retained around the two more cephalad wires and zero silk suture was  placed and allowed to hang loosely.   Sequentially an 8, 9.5 and 7-French sheaths were placed which were  passed.  A St. Jude Durata 71, 2, 65-cm active fixation dual-coil  defibrillator lead, serial number [id number], a Medtronic MB 2 coronary  sinus cannulation catheter and a St. Jude 16, 88 TC, 52 cm active  fixation atrial lead, serial number [id number].   Under fluoroscopic guidance the RV lead was made to the RV apex where  the  bipolar R wave was 15 with a pace impedance of 1011 ohms, a  threshold 0.8 volts at 0.5 milliseconds.  Current threshold 0.9 mA.  The  current of injury was modest and there is no diaphragmatic pacing at 10  volts.  This lead was secured to the prepectoral fascia.   We then cannulated the coronary sinus with the MB 2 cannulation catheter  and a Wholey wire.  This was relatively easy to do.  What was difficult  though was to pass the sheath passed a serpiginous bend in the very  proximal portion of the coronary sinus.  I initially tried an attained  select straight catheter.  This failed to traverse.  We then passed  second Wholey wire in through the sheath and we were able then to wiggle  it.  Contrast venogram of the system demonstrated a very proximal vein  branch that coursed onto the lateral wall.  A low posterior branch that  was very small and then a couple of the mid to high lateral branches  that coursed down onto the lateral wall.  We elected the latter as a  target.  We then used a Whisper wire in conjunction with a St. Jude 11,  56 T  passive fixation bipolar LV lead serial number [id number].  Under  fluoroscopic guidance it was manipulated to mid position between the  base and apex on the lateral wall of the heart.  We tried a variety of  configurations some had diaphragmatic stimulation, others did not and so  we elected to leave it in this location.  The tip to coil was 10.1 with  pace impedance of 1463, a threshold 1.7 and 0.5 and current threshold  1.3 mA.  Ring to coil with 10 with a pace impedance of 624, threshold 2  volts at 0.5 and current threshold was less than 0.1 mA.  Initially we  had diaphragmatic pacing with bipolar, but not tip to coil, we then had  diaphragmatic stimulation with bipolar and tip to coil, but not with  ring to coil.   The 9.5-French sheath was removed.  The MB 2 sheath was left in place  and through the last sheath the atrial lead was admitted  to the right  atrial appendage where bipolar P-wave was 4.2 with a pace impedance of  708 ohms, a threshold 0.8 volts at 0.5 milliseconds.  Current threshold  1.1 mA diaphragmatic.  There is no diaphragmatic pacing at 10 volts and  the current of injury was brisk.  This lead was secured to the  prepectoral fascia and the coronary sinus delivery system was removed  and the parameters rechecked and were consistent with the previously  recorded numbers.  The LV lead was secured to the prepectoral fascia.   The leads were then attached to a St. Jude Promote RS 30, 207 CRT  defibrillator serial number [id number].  Through the device bipolar P-wave  was 3.9 with a pace impedance of 650 ohms, threshold 0.75 volts at 0.5  milliseconds.  The bipolar R wave was 8.5 with a pace impedance of 740  ohms, threshold 0.5 volts at 0.5 milliseconds and the LV impedance was  590 with a threshold 0.5 volts at 0.5 milliseconds in a ring to coil  configuration.  High-voltage shock was 50 ohms.   DFT testing was then undertaken.  Ventricular fibrillation was induced  via the T-wave shock.  After a total duration of 6 seconds a 15 joules  shock was delivered through a measured resistance of 49 ohms,  terminating ventricular fibrillation and restoring sinus rhythm.  The  device was then implanted.   The pocket was copiously irrigated with antibiotic containing saline  solution.  Hemostasis was assured.  The leads and pulse generator were  placed in the pocket and secured to the prepectoral fascia.  The wound  was closed in three layers in normal fashion.  Wound was washed and  dried and a benzoin Steri-Strip dressing was applied.  Needle counts,  sponge counts and instrument counts were correct at the end of procedure  according to staff.  The patient tolerated the procedure without  apparent complication.      Duke Salvia, MD, [facility]  Electronically Signed     SCK/MEDQ  D:  02/21/2007  T:  02/22/2007   Job:  [id number]   cc:   Augustina Mood, Dr.  Electrophysiology Laboratory  [facility]

## 2010-09-20 NOTE — Assessment & Plan Note (Signed)
[facility] OFFICE NOTE   Luke Gill, Luke Gill                        MRN:          [id number]  DATE:03/25/2007                            DOB:          01-May-1951    PRIMARY CARE PHYSICIAN:  Dr. Vonita Moss.   INTERVAL HISTORY:  Luke Gill is a delightful 60 year old male with a  history of hypertension, obesity, and congestive heart failure secondary  to nonischemic cardiomyopathy, onset in May 2008.  Initial ejection  fraction was 10-15% by cardiac catheterization, which also showed normal  coronary arteries.  He recently underwent placement of BiV ICD by Dr.  Graciela Husbands.  Ejection fraction at that time was 35-40%.  His course has been  complicated by dizziness with severe orthostatic hypotension.  At his  last visit, we changed him over from Coreg to Toprol to see if this  would help.   Over the past 2 weeks, he says he has felt the best that he has felt in  a long time.  He only has minimal dizziness when he stands up too  quickly or bends over.  It is no longer debilitating.  Denies any chest  pain.  No significant shortness of breath.  He is back to his usual  activities.  He has not had any orthopnea or PND.  His lower extremity  edema has been well controlled.  He has not been able to get his  compression stockings yet.   He continues to have severe burping and reflux.  He has been followed by  Dr. Arlyce Dice in [place].   CURRENT MEDICATIONS:  1. Aspirin 81.  2. Lanoxin 250 mcg a day.  3. Protonix 40 a day.  4. AcipHex 20 a day.  5. Lisinopril 10 a day.  6. Toprol 25 mg at night.  7. His spironolactone and Coreg have recently been discontinued.   PHYSICAL EXAM:  He is in no acute distress.  He does have frequent  burping.  He ambulates around the clinic without any respiratory  difficulty.  Blood pressure 146/88, heart rate 86, weight 276, which is stable.  HEENT:  Normal.  NECK:  Supple.  No  JVD.  Carotids are 2+ bilaterally without bruits.  There is no lymphadenopathy or thyromegaly.  PMI is mildly laterally displaced.  He had a regular rate and rhythm.  No murmurs, rubs, or gallops.  LUNGS:  Clear.  ABDOMEN:  Soft, obese, nontender.  Nondistended.  No hepatosplenomegaly.  No bruits.  No masses.  Good bowel sounds.  EXTREMITIES:  Warm with no cyanosis, clubbing, or edema.  He does have  skin changes consistent with chronic venous stasis and previously healed  ulcers.  NEUROLOGIC:  He is alert and oriented x3.  Cranial nerves 2-12 are  intact.  Moves all 4 extremities without difficulty.  Affect is  pleasant.   ASSESSMENT AND PLAN:  1. Congestive heart failure secondary to nonischemic cardiomyopathy.      Ejection fraction is now 35-40%.  New York Heart Association      functional class II.  Unfortunately, we have  had to modify his      regimen greatly due to his orthostatic hypotension.  We will keep      his ACE inhibitor where it is at right now and try to increase his      Toprol to 50 once a day.  We will see him back in several weeks to      try to push it a bit further.  2. Orthostatic hypotension, as above, this is improved.  He is off of      all diuretics.  We will put the compression hose on him.      Currently, his resting blood pressure is slightly elevated, but we      will tolerate this in the setting of his orthostasis.  3. Gastroesophageal reflux disease.  He will follow up with Dr.      Arlyce Dice.   DISPOSITION:  Return to clinic in 1 month.     [name], MD  Electronically Signed    DRB/MedQ  DD: 03/25/2007  DT: 03/25/2007  Job #: [id number]   cc:   Dr. Vonita Moss

## 2010-09-20 NOTE — Assessment & Plan Note (Signed)
[facility] OFFICE NOTE   Luke Gill, Luke Gill                        MRN:          [id number]  DATE:09/11/2007                            DOB:          May 31, 1950    PRIMARY CARE PHYSICIAN:  Dr. Vonita Moss.   INTERVAL HISTORY:  Luke Gill is a 60 year old male with a history of  hypertension, obesity, severe orthostatic hypotension, and congestive  heart failure secondary to nonischemic cardiomyopathy.  He is status  post BiV ICD.  Ejection fraction was originally 10-15%, now is 35-40%.   He has been having problems of severe orthostatic hypotension.  We  eventually put him on midodrine and now he is doing much better.  He  says he occasionally gets dizzy, but this is much more rare and he has  not had any presyncope.  He is very happy with how he has been feeling.  Denies any chest pain or shortness of breath.  He is back to playing  nine holes of golf.   Blood pressure at home notes his systolics are running anywhere from 120  to 140 with diastolics in the 80 to 90 range.   CURRENT MEDICATIONS:  1. Aspirin 81.  2. Lanoxin 0.25 a day.  3. Protonix 40 a day.  4. Flonase.  5. Zoloft 50 a day.  6. Spironolactone 12.5 a day.  7. Lisinopril 20 b.i.d.  8. Metoprolol 25 b.i.d.  9. Midodrine 2.5 b.i.d.   PHYSICAL EXAMINATION:  GENERAL:  He is in no acute distress.  Ambulates  around the clinic without respiratory difficulty.  VITAL SIGNS:  Blood pressure is 108/84, heart rate 72, weight 270.  HEENT is normal.  NECK:  Supple.  No JVD.  Carotids 2+ bilateral without bruits.  There is  no lymphadenopathy or thyromegaly.  CARDIAC:  PMI is nondisplaced.  Regular rate and rhythm.  No murmurs and  no S3.  LUNGS:  Clear.  ABDOMEN:  Obese, nontender, nondistended.  No hepatosplenomegaly.  No  bruits, no masses.  Good bowel sounds.  EXTREMITIES:  Warm with no cyanosis, clubbing or edema.  He does have  some scars from  previous healed ulcers.  No rash.  NEURO:  Alert and x3.  Cranial nerves II-XII intact.  Moves all for extremities without  difficulty.  Affect is pleasant.   ASSESSMENT/PLAN:  1. Orthostatic hypotension.  This is much improved with midodrine.      Will continue current dose.  I would hope that over the next year      we can think weaning this down some.  2. Congestive heart failure.  He is stable NYHA Class II.  We will try      to go up on his metoprolol to 50 b.i.d. and see how he tolerates      it.  I told him if he is not feeling well, he is to  cut back to 25      b.i.d.  We will get a follow-up echocardiogram when he returns in 2      months to see if he  has had any further recovery of LV function.     Luke Gill. Bensimhon, MD  Electronically Signed    DRB/MedQ  DD: 09/11/2007  DT: 09/11/2007  Job #: [id number]

## 2010-09-20 NOTE — Assessment & Plan Note (Signed)
[facility]                         ELECTROPHYSIOLOGY OFFICE NOTE   Luke, Gill                        MRN:          [id number]  DATE:06/12/2007                            DOB:          [birth year]    Luke Gill is seen following CRT implantation.  He was enrolled in the  RESPONSE trial for St. Jude.   His major issues now are fatigue and disequilibrium.  The latter  translates into orthostatic hypotension with 60 and 70 mm  blood  pressure drops from a systolic supine blood pressure of 190 or so.   He has also noted increasing fatigue since his Toprol was up-titrated  from 50-100, this having replaced Coreg because of significant  aggravations of his dizziness.   CURRENT MEDICATIONS:  Aspirin, AcipHex, lisinopril 20, metoprolol 100,  sertraline 50.   PHYSICAL EXAMINATION:  VITAL SIGNS:  His blood pressure was elevated at  191/97 and his weight was 275 which is down 10 pounds since October, his  pulse was 66.  LUNGS:  Clear.  HEART:  His heart sounds were regular.  EXTREMITIES:  Without edema.  NECK:  Neck veins were flat.  CHEST:  The device pocket was well-healed.   Interrogation of his device demonstrated P-wave of two with impedance of  510, a threshold of 0.5 at 0.5 in both the RV and RA with an R-wave of  10, impedance of 530.  The LV impedance was 600 with threshold of 0.5 at  0.5.  The device was reprogrammed.  Heart rate excursion was adequate.   IMPRESSION:  1. Nonischemic cardiomyopathy.  2. Status post CRT for the above.  3. Congestive heart failure - chronic - systolic - improved.  4. Orthostatic hypotension with systolic supine hypertension - severe.  5. Obesity.   The issue for Luke Gill is going to be can we ameliorate his  orthostatic hypotension.  Luke Gill's group has used Mestinon in this  cohort because postganglionic nerve traffic is markedly increased with  changes in position and they have found that  attenuation of this nerve  traffic by the acetylcholinesterase inhibitor is associated with  improvements in orthostatic hypotension.   We will plan to start this at 60 mg twice daily.  Because of the  fatigue, I will decreased the Toprol from 100 to 50 and we will also  increase his lisinopril from 20-40 to try to augment systolic  hypertension control and I have asked him take his antihypertensives in  the evening.     Luke Salvia, MD, [facility]  Electronically Signed    SCK/MedQ  DD: 06/12/2007  DT: 06/13/2007  Job #: [id number]   cc:   Luke Buckles. Bensimhon, MD

## 2010-09-20 NOTE — Assessment & Plan Note (Signed)
[facility]                                 ON-CALL NOTE   ISAY, PERLEBERG                        MRN:          [id number]  DATE:10/31/2006                            DOB:          30-Mar-1951    HISTORY:  According to Chart Mr. Phifer is a 60 year old male who was  recently seen in the [facility] office on June 12. 2008.  He has a  history of dilated cardiomyopathy with an EF of 10%-15%, left bundle  branch block, hypertension, morbid obesity, chronic lower extremity  edema.   He called on the morning of October 31, 2006 at approximately 6 a.m. to the  fellow, (however the fellow passed this call on to me) complaining of a  2-pound weight gain within the last 24 hours and a slight increase of  his lower extremity edema.  He denies any shortness of breath or  orthopnea.  He was concerned and wanted to know if he could be seen in  the office today.   I explained to Mr. Fendley being 6 a.m. in the morning I did not have  the office schedule and I was not aware as to where Dr. Prescott Gum  location was.  I stated that he could call back the office after 8  o'clock to see if he could be seen today.  I reviewed  his medications,  I informed him with the 2-pound weight gain and the slight increase in  edema, it would be safe for him to take an additional 40 mg of Lasix and  an additional 20 mEq of potassium until he talks to someone in the  office.  I also explained to him if he should develop any symptoms in  the interim 2-hour time period that he should proceed to the nearest  emergency room for further evaluation, but to please call us prior to  doing so that we may anticipate his arrival.  He is agreeable with this  plan.      Joellyn Rued, PA-C  Electronically Signed      [name], MD  Electronically Signed   EW/MedQ  DD: 10/31/2006  DT: 10/31/2006  Job #: [id number]

## 2010-09-20 NOTE — Assessment & Plan Note (Signed)
[facility] OFFICE NOTE   Luke Gill, Luke Gill                        MRN:          [id number]  DATE:02/11/2007                            DOB:          October 31, 1950    INTERVAL HISTORY:  Mr. Luke Gill is a delightful 60 year old male with a  history of hypertension, obesity, and congestive heart failure secondary  to a nonischemic cardiomyopathy which was diagnosed in mid May.  Initial  ejection fraction was 10-15% by cardiac catheterization with normal  coronary arteries.  He also has a left bundle branch block.  Most recent  ejection fraction is 30%.   He presents today for followup.  He continues to have problems with  significant lightheadedness.  This can happen sometimes with standing up  or other times just when he is walking.  He is only able to play 6 or 7  holes of golf before he just feels weak.  We have been down titrating  his Coreg.  He recently saw Dr. Graciela Husbands for consideration of an ICD and  Dr. Graciela Husbands also decreased his lisinopril.  He also underwent  cardiopulmonary exercise testing.  This showed a peak VO2 of 18.6, when  corrected for his weight was 26.3.  His slope was normal at 23.  His  OUES was normal at 3.14, and his RAR was 1.1.  O2 pulse was normal at  100% of predicted.  He did notice progressive drop in blood pressure  over the last 3 stages of exercise.   CURRENT MEDICATIONS:  1. Aspirin 81  2. Digoxin 250 mcg a day  3. Protonix 40 a day  4. Aciphex 20 a day  5. Spironolactone 25 a day  6. Coreg 12.5 b.i.d.  7. Lisinopril 10 a day   PHYSICAL EXAMINATION:  He is in no acute distress, ambulates around the  clinic without any respiratory difficulty.  He continues to have  significant problems with incessant belching.  Blood pressure lying down  was 144/86 with a heart rate of 77.  After 5 minutes of standing he went  to 86/64 with heart rate of 99 and he was lightheaded.  HEENT:   Normal.  NECK:  Supple, no JVD.  Carotids are 2+ bilaterally without any bruits.  There is no lymphadenopathy or thyromegaly.  CARDIAC:  PMI is mildly laterally displaced, irregular rate and rhythm  with no S3, no murmur.  LUNGS:  Clear.  ABDOMEN:  Obese, nontender, nondistended.  No hepatosplenomegaly, no  bruits, no masses.  Good bowel sounds.  EXTREMITIES:  Warm, with no cyanosis, clubbing.  There is no edema.  He  does have some skin changes due to chronic venous stasis, with some mild  excoriations.  NEURO:  Alert and oriented x3.  Cranial nerves II-XII intact.  Moves all  4 extremities without difficulty.  Affect is pleasant.   ASSESSMENT AND PLAN:  1. Congestive heart failure secondary to nonischemic cardiomyopathy.      He remains NYHA functional Class III.  His volume statis looks      good.  By CPX his  functional status looks pretty good, though      perhaps not as good as I would like to see it for a man his age.      The question regarding his hypotension is whether this is central      or peripheral in nature.  Given his orthostasis, I am suspecting      that it is more peripheral and he has a significant component of      autonomic insufficiency.  I have asked him to liberalize his salt      just gently and to see how it goes.  2. With regard to device therapy, obviously he needs a defibrillator.      The question of whether or not he should get a Bi-V device is what      remains.  Given his left bundle and the fact that he does have      significant LV dysfunction with Class III symptoms, I think he has      a clear indication for Bi-V pacing and I suggested that we proceed      with a Bi-V ICD.  I will discuss this with Dr. Graciela Husbands.     Luke Buckles. Bensimhon, MD  Electronically Signed    DRB/MedQ  DD: 02/11/2007  DT: 02/11/2007  Job #: [id number]

## 2010-10-07 ENCOUNTER — Ambulatory Visit: Payer: Self-pay | Admitting: Internal Medicine

## 2010-10-11 ENCOUNTER — Ambulatory Visit: Payer: Self-pay | Admitting: Internal Medicine

## 2010-11-06 ENCOUNTER — Ambulatory Visit: Payer: Self-pay | Admitting: Internal Medicine

## 2010-11-16 ENCOUNTER — Ambulatory Visit: Payer: Self-pay | Admitting: Gastroenterology

## 2010-11-22 ENCOUNTER — Encounter: Payer: Self-pay | Admitting: Internal Medicine

## 2010-11-23 ENCOUNTER — Ambulatory Visit: Payer: Self-pay | Admitting: Gastroenterology

## 2010-12-07 ENCOUNTER — Ambulatory Visit: Payer: Self-pay | Admitting: Internal Medicine

## 2010-12-07 ENCOUNTER — Ambulatory Visit: Payer: Self-pay | Admitting: Gastroenterology

## 2010-12-21 ENCOUNTER — Ambulatory Visit: Payer: Self-pay | Admitting: Gastroenterology

## 2011-01-06 ENCOUNTER — Encounter: Payer: Self-pay | Admitting: Cardiovascular Disease

## 2011-01-06 ENCOUNTER — Encounter: Payer: Self-pay | Admitting: *Deleted

## 2011-01-07 ENCOUNTER — Ambulatory Visit: Payer: Self-pay | Admitting: Internal Medicine

## 2011-01-11 ENCOUNTER — Other Ambulatory Visit: Payer: Self-pay | Admitting: Cardiovascular Disease

## 2011-01-11 DIAGNOSIS — I5022 Chronic systolic (congestive) heart failure: Secondary | ICD-10-CM

## 2011-01-11 DIAGNOSIS — I313 Pericardial effusion (noninflammatory): Secondary | ICD-10-CM

## 2011-01-12 ENCOUNTER — Ambulatory Visit (INDEPENDENT_AMBULATORY_CARE_PROVIDER_SITE_OTHER): Payer: Managed Care, Other (non HMO) | Admitting: *Deleted

## 2011-01-12 ENCOUNTER — Encounter: Payer: Self-pay | Admitting: Internal Medicine

## 2011-01-12 DIAGNOSIS — I5022 Chronic systolic (congestive) heart failure: Secondary | ICD-10-CM

## 2011-01-12 DIAGNOSIS — I428 Other cardiomyopathies: Secondary | ICD-10-CM

## 2011-01-12 LAB — ICD DEVICE OBSERVATION
AL AMPLITUDE: 2.1 mv
AL IMPEDENCE ICD: 362.5 Ohm
AL THRESHOLD: 0.5 V
BATTERY VOLTAGE: 2.5718 V
DEVICE MODEL ICD: 508162
FVT: 0
HV IMPEDENCE: 37 Ohm
MODE SWITCH EPISODES: 1
PACEART VT: 1
RV LEAD AMPLITUDE: 5.9 mv
RV LEAD THRESHOLD: 0.75 V
TOT-0009: 3
TOT-0010: 1
TZAT-0001FASTVT: 1
TZAT-0001SLOWVT: 1
TZAT-0004SLOWVT: 8
TZAT-0012FASTVT: 200 ms
TZAT-0013FASTVT: 1
TZAT-0019FASTVT: 7.5 V
TZAT-0020FASTVT: 1 ms
TZON-0003SLOWVT: 340 ms
TZON-0004FASTVT: 12
TZON-0004SLOWVT: 12
TZON-0005FASTVT: 6
TZON-0005SLOWVT: 6
TZST-0001FASTVT: 2
TZST-0001FASTVT: 4
TZST-0001FASTVT: 5
TZST-0001SLOWVT: 2
TZST-0001SLOWVT: 5
TZST-0003FASTVT: 30 J
TZST-0003FASTVT: 36 J
TZST-0003FASTVT: 36 J
TZST-0003SLOWVT: 20 J
TZST-0003SLOWVT: 36 J
VF: 2

## 2011-01-24 ENCOUNTER — Ambulatory Visit: Payer: Managed Care, Other (non HMO) | Admitting: Physician Assistant

## 2011-01-24 ENCOUNTER — Ambulatory Visit (INDEPENDENT_AMBULATORY_CARE_PROVIDER_SITE_OTHER): Payer: Managed Care, Other (non HMO) | Admitting: Cardiovascular Disease

## 2011-01-24 ENCOUNTER — Other Ambulatory Visit (INDEPENDENT_AMBULATORY_CARE_PROVIDER_SITE_OTHER): Payer: Managed Care, Other (non HMO) | Admitting: *Deleted

## 2011-01-24 ENCOUNTER — Encounter: Payer: Self-pay | Admitting: Cardiovascular Disease

## 2011-01-24 DIAGNOSIS — I313 Pericardial effusion (noninflammatory): Secondary | ICD-10-CM

## 2011-01-24 DIAGNOSIS — I951 Orthostatic hypotension: Secondary | ICD-10-CM

## 2011-01-24 DIAGNOSIS — I5022 Chronic systolic (congestive) heart failure: Secondary | ICD-10-CM

## 2011-01-24 DIAGNOSIS — I319 Disease of pericardium, unspecified: Secondary | ICD-10-CM

## 2011-01-24 DIAGNOSIS — I2510 Atherosclerotic heart disease of native coronary artery without angina pectoris: Secondary | ICD-10-CM

## 2011-01-24 DIAGNOSIS — I3139 Other pericardial effusion (noninflammatory): Secondary | ICD-10-CM | POA: Insufficient documentation

## 2011-01-24 NOTE — Assessment & Plan Note (Signed)
Orthostatic hypotension has not been an issue without his medications. He currently feels well, playing golf.

## 2011-01-24 NOTE — Progress Notes (Signed)
Patient ID: Luke Gill, male    DOB: 1951-03-26, 60 y.o.   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 His EF  recovered Echo 4/10 was 55-60%.but redent echo shows intercurrent worening to  45 to 50%.   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  Tumor was inoperable. He has completes chemo and XRT. Secondary to dysphasia, he is having esophageal ballooning for strictures. He has a feeding tube to maintain nutrition.  His metoprolol and ACE inhibitor was held secondary to lightheadedness and fatigue. He feels better without the medication.  Recent evaluation in the hospital with CT scan showed a pericardial effusion measuring 1.8 cm that appeared new. CT Scan was dated January 06 2011. Echocardiogram performed today shows a small to moderate size pericardial effusion, not hemodynamically significant.  EKG shows paced rhythm at 72 beats per minute     Outpatient Encounter Prescriptions as of 01/24/2011  Medication Sig Dispense Refill  . aspirin (ASPIRIN ADULT LOW STRENGTH) 81 MG EC tablet Take 81 mg by mouth daily.        . nitroGLYCERIN (NITROSTAT) 0.4 MG SL tablet Place 0.4 mg under the tongue every 5 (five) minutes as needed.        . ranitidine (ZANTAC) 150 MG/10ML syrup Take by mouth 2 (two) times daily. 10ml bid       . sertraline (ZOLOFT) 20 MG/ML concentrated solution Take by mouth daily. 2.61ml daily       . sertraline (ZOLOFT) 50 MG tablet Take 50 mg by mouth daily.            Review of Systems  Constitutional: Positive for appetite change and unexpected weight change.  HENT: Negative.   Eyes: Negative.   Respiratory: Negative.   Cardiovascular: Negative.   Gastrointestinal: Negative.    Dysphagia, anorexia  Musculoskeletal: Negative.   Skin: Negative.   Neurological: Negative.   Hematological: Negative.   Psychiatric/Behavioral: Negative.   All other systems reviewed and are negative.    BP 128/80  Pulse 72  Ht 6\' 3"  (1.905 m)  Wt 239 lb (108.41 kg)  BMI 29.87 kg/m2   Physical Exam  Nursing note and vitals reviewed. Constitutional: He is oriented to person, place, and time. He appears well-developed and well-nourished.  HENT:  Head: Normocephalic.  Nose: Nose normal.  Mouth/Throat: Oropharynx is clear and moist.  Eyes: Conjunctivae are normal. Pupils are equal, round, and reactive to light.  Neck: Normal range of motion. Neck supple. No JVD present.  Cardiovascular: Normal rate, regular rhythm, S1 normal, S2 normal, normal heart sounds and intact distal pulses.  Exam reveals no gallop and no friction rub.   No murmur heard. Pulmonary/Chest: Effort normal and breath sounds normal. No respiratory distress. He has no wheezes. He has no rales. He exhibits no tenderness.  Abdominal: Soft. Bowel sounds are normal. He exhibits no distension. There is no tenderness.  Musculoskeletal: Normal range of motion. He exhibits no edema and no tenderness.  Lymphadenopathy:    He has no cervical adenopathy.  Neurological: He is alert and oriented to person, place, and time. Coordination normal.  Skin: Skin is warm and dry. No rash noted. No erythema.  Psychiatric: He has a normal mood and affect. His behavior  is normal. Judgment and thought content normal.           Assessment and Plan

## 2011-01-24 NOTE — Assessment & Plan Note (Signed)
Preliminary review of the echocardiogram shows a Mild to moderate sized pericardial effusion with no hemodynamic compromise. As he has no significant shortness of breath, lightheadedness with exertion, no intervention is needed at this time. I suggested that he could proceed with any further cancer treatment as needed. He reports that he is in a watchful waiting for now. I suggested he contact us for worsening shortness of breath, chest tightness, lightheadedness or any new symptoms.

## 2011-01-24 NOTE — Assessment & Plan Note (Signed)
Echocardiogram performed today shows ejection fraction 45-50% which is unchanged from previous EF. He feels better without beta blocker and ACE inhibitor. Given his situation, we will not restart any medications though he could probably handle a very low dose ACE inhibitor and low-dose Coreg.

## 2011-01-24 NOTE — Patient Instructions (Signed)
You are doing well. No medication changes were made. Please call us if you have new issues that need to be addressed before your next appt.  We will call you for a follow up Appt. In 6 months  

## 2011-02-05 ENCOUNTER — Inpatient Hospital Stay: Payer: Self-pay | Admitting: Internal Medicine

## 2011-02-06 ENCOUNTER — Ambulatory Visit: Payer: Self-pay | Admitting: Internal Medicine

## 2011-02-08 ENCOUNTER — Emergency Department: Payer: Self-pay | Admitting: Emergency Medicine

## 2011-02-13 ENCOUNTER — Ambulatory Visit: Payer: Self-pay | Admitting: Family Medicine

## 2011-03-06 ENCOUNTER — Ambulatory Visit: Payer: Self-pay | Admitting: Family Medicine

## 2011-03-10 ENCOUNTER — Encounter: Payer: Self-pay | Admitting: Family Medicine

## 2011-03-24 ENCOUNTER — Ambulatory Visit: Payer: Self-pay | Admitting: Internal Medicine

## 2011-03-25 ENCOUNTER — Emergency Department: Payer: Self-pay | Admitting: Emergency Medicine

## 2011-04-08 ENCOUNTER — Ambulatory Visit: Payer: Self-pay | Admitting: Internal Medicine

## 2011-04-08 ENCOUNTER — Encounter: Payer: Self-pay | Admitting: Family Medicine

## 2011-04-18 ENCOUNTER — Inpatient Hospital Stay: Payer: Self-pay | Admitting: Internal Medicine

## 2011-04-18 DIAGNOSIS — R0602 Shortness of breath: Secondary | ICD-10-CM

## 2011-05-09 ENCOUNTER — Ambulatory Visit: Payer: Self-pay | Admitting: Internal Medicine

## 2011-06-09 ENCOUNTER — Ambulatory Visit: Payer: Self-pay | Admitting: Internal Medicine

## 2011-07-07 ENCOUNTER — Ambulatory Visit: Payer: Self-pay | Admitting: Internal Medicine

## 2011-07-20 LAB — CREATININE, SERUM
Creatinine: 0.87 mg/dL (ref 0.60–1.30)
EGFR (African American): >60

## 2011-08-07 ENCOUNTER — Ambulatory Visit: Payer: Self-pay | Admitting: Internal Medicine

## 2011-09-06 ENCOUNTER — Ambulatory Visit: Payer: Self-pay | Admitting: Internal Medicine

## 2011-09-27 ENCOUNTER — Inpatient Hospital Stay: Payer: Self-pay | Admitting: Specialist

## 2011-09-27 LAB — CBC
HGB: 7.6 g/dL — ABNORMAL LOW (ref 13.0–18.0)
Platelet: 156 10*3/uL (ref 150–440)
RBC: 3.16 10*6/uL — ABNORMAL LOW (ref 4.40–5.90)
RDW: 18.1 % — ABNORMAL HIGH (ref 11.5–14.5)
WBC: 15.9 10*3/uL — ABNORMAL HIGH (ref 3.8–10.6)

## 2011-09-27 LAB — COMPREHENSIVE METABOLIC PANEL
BUN: 25 mg/dL — ABNORMAL HIGH (ref 7–18)
Bilirubin,Total: 0.4 mg/dL (ref 0.2–1.0)
Creatinine: 0.81 mg/dL (ref 0.60–1.30)
EGFR (Non-African Amer.): >60
SGOT(AST): 27 U/L (ref 15–37)
Sodium: 142 mmol/L (ref 136–145)

## 2011-09-27 LAB — FOLATE: Folic Acid: 25.7 ng/mL (ref 3.1–100.0)

## 2011-09-27 LAB — CK TOTAL AND CKMB (NOT AT ARMC): CK-MB: 0.8 ng/mL (ref 0.5–3.6)

## 2011-09-27 LAB — IRON AND TIBC: Unbound Iron-Bind.Cap.: 280 ug/dL

## 2011-09-27 LAB — FERRITIN: Ferritin (ARMC): 39 ng/mL (ref 8–388)

## 2011-09-28 LAB — CBC WITH DIFFERENTIAL/PLATELET
Basophil %: 0 %
Eosinophil #: 0 10*3/uL (ref 0.0–0.7)
Eosinophil %: 0 %
HCT: 24.9 % — ABNORMAL LOW (ref 40.0–52.0)
HGB: 7.7 g/dL — ABNORMAL LOW (ref 13.0–18.0)
MCH: 24.5 pg — ABNORMAL LOW (ref 26.0–34.0)
MCHC: 31.1 g/dL — ABNORMAL LOW (ref 32.0–36.0)
Monocyte #: 0.6 x10 3/mm (ref 0.2–1.0)
Neutrophil #: 15.2 10*3/uL — ABNORMAL HIGH (ref 1.4–6.5)
Neutrophil %: 94 %
Platelet: 154 10*3/uL (ref 150–440)
RBC: 3.16 10*6/uL — ABNORMAL LOW (ref 4.40–5.90)
WBC: 16.2 10*3/uL — ABNORMAL HIGH (ref 3.8–10.6)

## 2011-09-29 LAB — CBC WITH DIFFERENTIAL/PLATELET
Basophil #: 0 10*3/uL (ref 0.0–0.1)
Eosinophil #: 0 10*3/uL (ref 0.0–0.7)
Eosinophil %: 0 %
HCT: 28.6 % — ABNORMAL LOW (ref 40.0–52.0)
HGB: 9 g/dL — ABNORMAL LOW (ref 13.0–18.0)
Lymphocyte %: 3.3 %
MCH: 25.3 pg — ABNORMAL LOW (ref 26.0–34.0)
MCHC: 31.4 g/dL — ABNORMAL LOW (ref 32.0–36.0)
Monocyte %: 10 %
Neutrophil #: 10.1 10*3/uL — ABNORMAL HIGH (ref 1.4–6.5)
RDW: 18.9 % — ABNORMAL HIGH (ref 11.5–14.5)

## 2011-09-30 LAB — EXPECTORATED SPUTUM ASSESSMENT W GRAM STAIN, RFLX TO RESP C

## 2011-10-06 ENCOUNTER — Inpatient Hospital Stay: Payer: Self-pay | Admitting: Internal Medicine

## 2011-10-06 LAB — FERRITIN: Ferritin (ARMC): 38 ng/mL (ref 8–388)

## 2011-10-06 LAB — URINALYSIS, COMPLETE
Bacteria: NONE SEEN
Bilirubin,UR: NEGATIVE
Leukocyte Esterase: NEGATIVE
Nitrite: NEGATIVE
RBC,UR: 6 /HPF (ref 0–5)
Specific Gravity: 1.012 (ref 1.003–1.030)
Squamous Epithelial: NONE SEEN
WBC UR: 4 /HPF (ref 0–5)

## 2011-10-06 LAB — PRO B NATRIURETIC PEPTIDE: B-Type Natriuretic Peptide: 700 pg/mL — ABNORMAL HIGH (ref 0–125)

## 2011-10-06 LAB — APTT: Activated PTT: 23 secs — ABNORMAL LOW (ref 23.6–35.9)

## 2011-10-06 LAB — COMPREHENSIVE METABOLIC PANEL
Albumin: 2.4 g/dL — ABNORMAL LOW (ref 3.4–5.0)
Alkaline Phosphatase: 58 U/L (ref 50–136)
BUN: 30 mg/dL — ABNORMAL HIGH (ref 7–18)
Bilirubin,Total: 0.2 mg/dL (ref 0.2–1.0)
Calcium, Total: 12.1 mg/dL — ABNORMAL HIGH (ref 8.5–10.1)
Chloride: 106 mmol/L (ref 98–107)
Co2: 38 mmol/L — ABNORMAL HIGH (ref 21–32)
Creatinine: 0.83 mg/dL (ref 0.60–1.30)
EGFR (African American): >60
EGFR (Non-African Amer.): >60
Glucose: 75 mg/dL (ref 65–99)
Osmolality: 295 (ref 275–301)
SGOT(AST): 23 U/L (ref 15–37)
SGPT (ALT): 24 U/L
Sodium: 146 mmol/L — ABNORMAL HIGH (ref 136–145)
Total Protein: 6.5 g/dL (ref 6.4–8.2)

## 2011-10-06 LAB — CBC
HCT: 21.7 % — ABNORMAL LOW (ref 40.0–52.0)
HGB: 6.5 g/dL — ABNORMAL LOW (ref 13.0–18.0)
Platelet: 204 10*3/uL (ref 150–440)

## 2011-10-06 LAB — PROTIME-INR: Prothrombin Time: 14.4 secs (ref 11.5–14.7)

## 2011-10-07 ENCOUNTER — Ambulatory Visit: Payer: Self-pay | Admitting: Oncology

## 2011-10-07 LAB — CBC WITH DIFFERENTIAL/PLATELET
Basophil %: 0.2 %
Eosinophil #: 0.2 10*3/uL (ref 0.0–0.7)
Eosinophil %: 1.4 %
Lymphocyte #: 0.6 10*3/uL — ABNORMAL LOW (ref 1.0–3.6)
Lymphocyte %: 3.7 %
MCHC: 32.3 g/dL (ref 32.0–36.0)
MCV: 82 fL (ref 80–100)
Monocyte #: 0.8 x10 3/mm (ref 0.2–1.0)
Monocyte %: 5.2 %
Neutrophil %: 89.5 %
Platelet: 167 10*3/uL (ref 150–440)
RBC: 3.1 10*6/uL — ABNORMAL LOW (ref 4.40–5.90)
RDW: 17.5 % — ABNORMAL HIGH (ref 11.5–14.5)

## 2011-10-07 LAB — BASIC METABOLIC PANEL
Anion Gap: 5 — ABNORMAL LOW (ref 7–16)
Calcium, Total: 11.7 mg/dL — ABNORMAL HIGH (ref 8.5–10.1)
Chloride: 104 mmol/L (ref 98–107)
Co2: 36 mmol/L — ABNORMAL HIGH (ref 21–32)
Creatinine: 0.95 mg/dL (ref 0.60–1.30)
EGFR (African American): >60
EGFR (Non-African Amer.): >60
Glucose: 115 mg/dL — ABNORMAL HIGH (ref 65–99)
Osmolality: 294 (ref 275–301)

## 2011-10-08 LAB — CBC WITH DIFFERENTIAL/PLATELET
Basophil #: 0.1 10*3/uL (ref 0.0–0.1)
Basophil %: 0.9 %
Eosinophil #: 0.1 10*3/uL (ref 0.0–0.7)
HCT: 24.7 % — ABNORMAL LOW (ref 40.0–52.0)
Lymphocyte #: 0.4 10*3/uL — ABNORMAL LOW (ref 1.0–3.6)
MCH: 26.1 pg (ref 26.0–34.0)
Monocyte #: 0.8 x10 3/mm (ref 0.2–1.0)
Monocyte %: 5.2 %
Neutrophil #: 14.2 10*3/uL — ABNORMAL HIGH (ref 1.4–6.5)
Neutrophil %: 90.8 %
Platelet: 167 10*3/uL (ref 150–440)
RBC: 3 10*6/uL — ABNORMAL LOW (ref 4.40–5.90)

## 2011-10-09 LAB — CBC WITH DIFFERENTIAL/PLATELET
Basophil #: 0 10*3/uL (ref 0.0–0.1)
Basophil %: 0.4 %
Eosinophil #: 0.2 10*3/uL (ref 0.0–0.7)
Eosinophil %: 1.3 %
HCT: 27.7 % — ABNORMAL LOW (ref 40.0–52.0)
HGB: 8.6 g/dL — ABNORMAL LOW (ref 13.0–18.0)
Lymphocyte #: 0.5 10*3/uL — ABNORMAL LOW (ref 1.0–3.6)
Lymphocyte %: 4.2 %
MCH: 26 pg (ref 26.0–34.0)
MCHC: 31.1 g/dL — ABNORMAL LOW (ref 32.0–36.0)
Monocyte #: 0.9 x10 3/mm (ref 0.2–1.0)
Monocyte %: 7.1 %
Neutrophil %: 87 %
Platelet: 184 10*3/uL (ref 150–440)
RDW: 17.8 % — ABNORMAL HIGH (ref 11.5–14.5)
WBC: 12.5 10*3/uL — ABNORMAL HIGH (ref 3.8–10.6)

## 2011-10-10 ENCOUNTER — Telehealth: Payer: Self-pay | Admitting: Internal Medicine

## 2011-10-10 LAB — HEMOGLOBIN: HGB: 9.6 g/dL — ABNORMAL LOW (ref 13.0–18.0)

## 2011-10-10 NOTE — Telephone Encounter (Signed)
New msg [facility] wants to talk to you about what to do if have to deactivate device. Please call

## 2011-10-11 LAB — CULTURE, BLOOD (SINGLE)

## 2011-10-11 NOTE — Telephone Encounter (Signed)
Follow-up:    Patient returned your call.  Please call back. 

## 2011-10-11 NOTE — Telephone Encounter (Signed)
Spoke with daughter.   Luke Gill is now in hospice and is requesting his ICD therapies to be turned off.  Dr. Graciela Husbands to write the order for same and we will call her @ [phone] and set up a time.

## 2011-10-11 NOTE — Telephone Encounter (Signed)
Hospice # disconnected.  Left message on patient's home phone.

## 2011-10-12 ENCOUNTER — Telehealth: Payer: Self-pay | Admitting: Internal Medicine

## 2011-10-12 NOTE — Telephone Encounter (Signed)
F/u   Patient wife Luke Gill calling back concerning turning off husband defib device [phone].  Address for patient hospice facility   [address] [place],  [place], [place] [address]  Room# 16

## 2011-10-12 NOTE — Telephone Encounter (Signed)
New msg Pt is to get this device deactivated, she wants to know who to call

## 2011-10-16 NOTE — Telephone Encounter (Signed)
ICD therapies disabled

## 2011-11-06 ENCOUNTER — Ambulatory Visit: Payer: Self-pay | Admitting: Oncology

## 2011-11-06 ENCOUNTER — Ambulatory Visit: Payer: Self-pay | Admitting: Internal Medicine

## 2011-11-06 DEATH — deceased

## 2012-08-22 ENCOUNTER — Telehealth: Payer: Self-pay | Admitting: Internal Medicine

## 2012-08-22 NOTE — Telephone Encounter (Signed)
Pt deceased per obits 02-Nov-2011/mt

## 2013-03-30 IMAGING — CT CT CHEST-ABD-PELV W/ CM
1 of 3 series · 12 of 32 positions shown, 17 images · non-contrast
Comparison: none

REASON FOR EXAM: (1) Give IV contrast only. Unable to drink oral
contrast. Metastatic esophageal
COMMENTS:

[Series 2: soft tissue · axial · 0.82mm/px · z∈[-189,+468]mm · 12 of 246 slices shown, 17 images]
[im 14/246  soft-tissue]
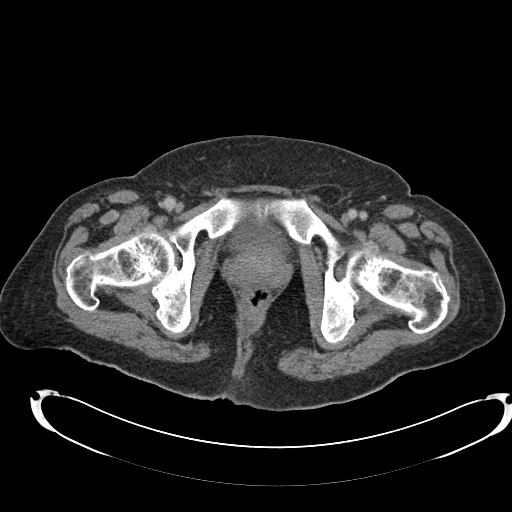
[im 14/246  bone]
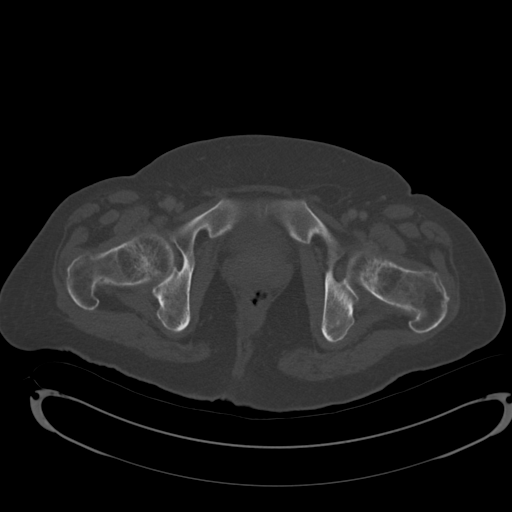
[im 41/246  soft-tissue]
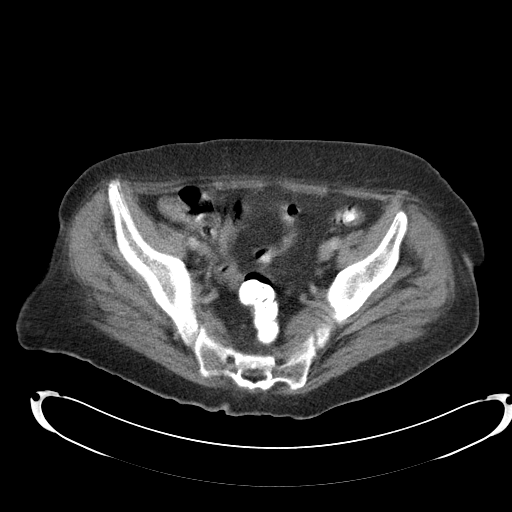
[im 55/246  soft-tissue]
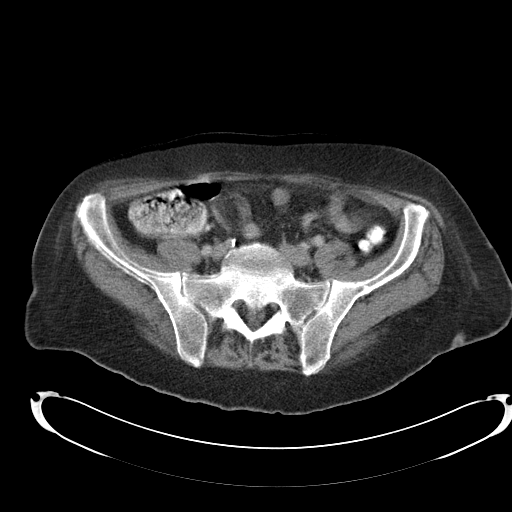
[im 82/246  soft-tissue]
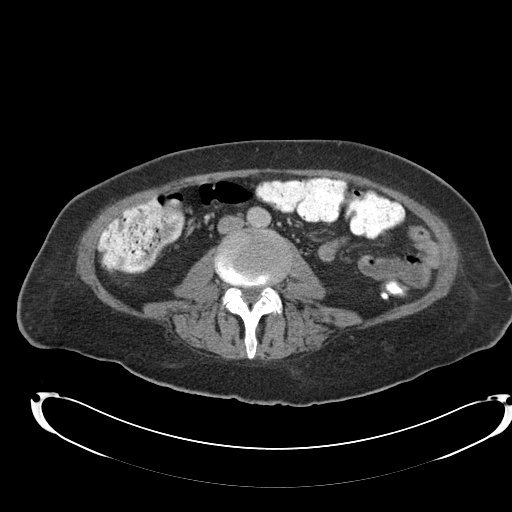
[im 96/246  soft-tissue]
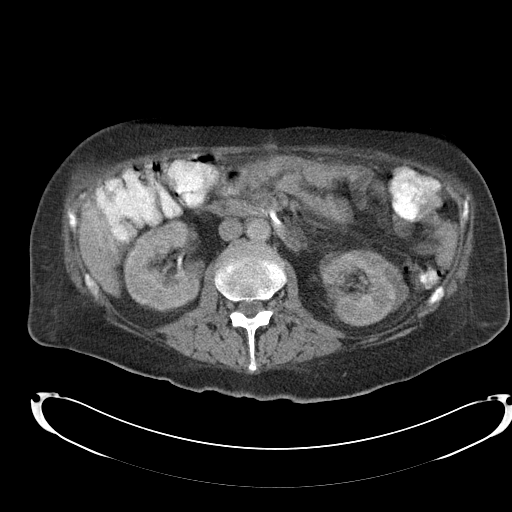
[im 123/246  soft-tissue]
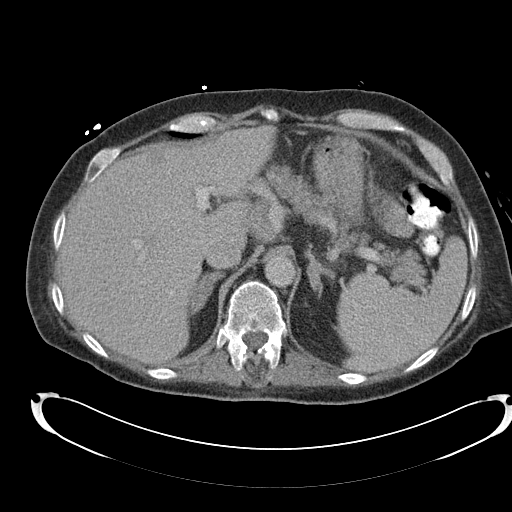
[im 150/246  soft-tissue]
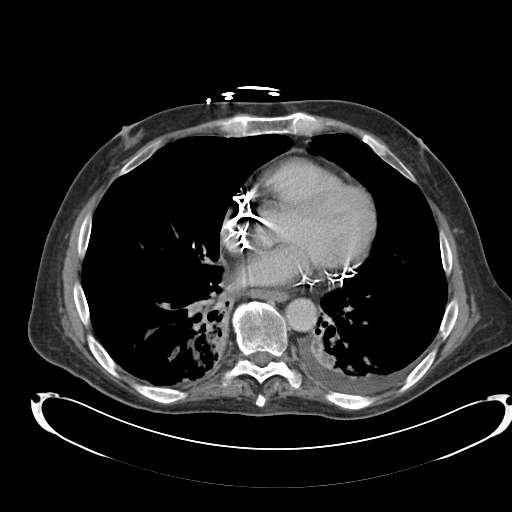
[im 164/246  soft-tissue]
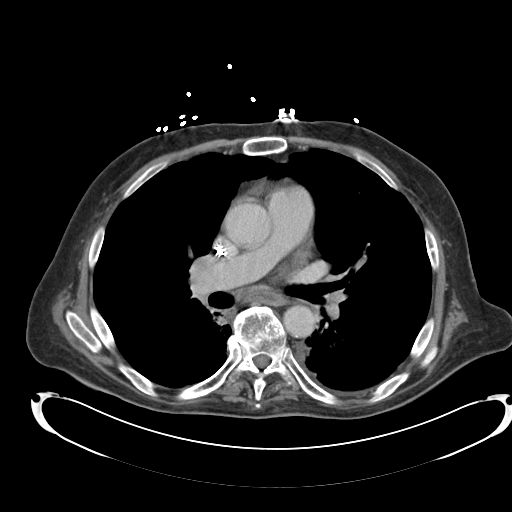
[im 191/246  soft-tissue]
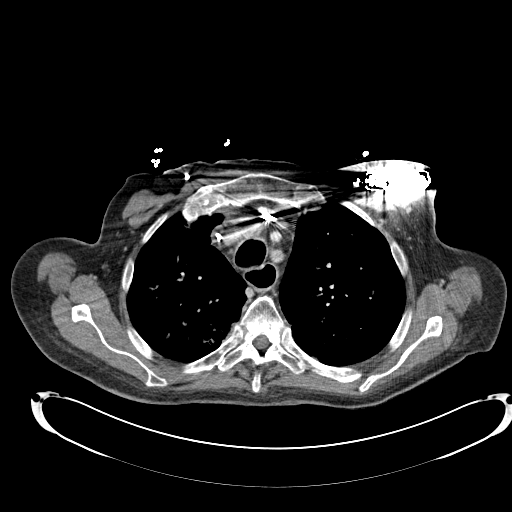
[im 191/246  lung]
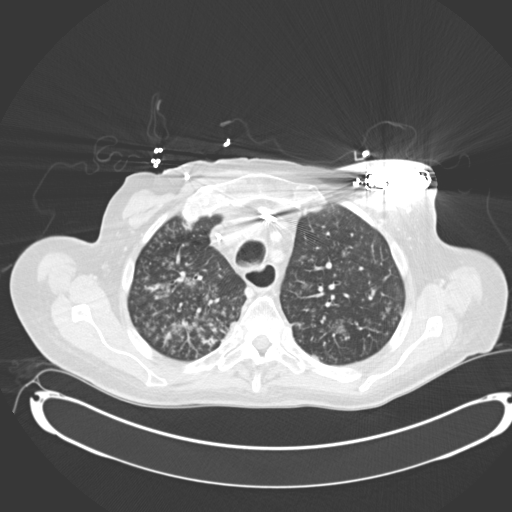
[im 191/246  bone]
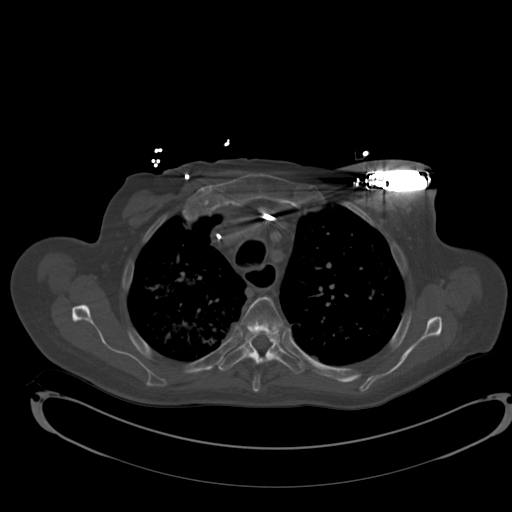
[im 205/246  soft-tissue]
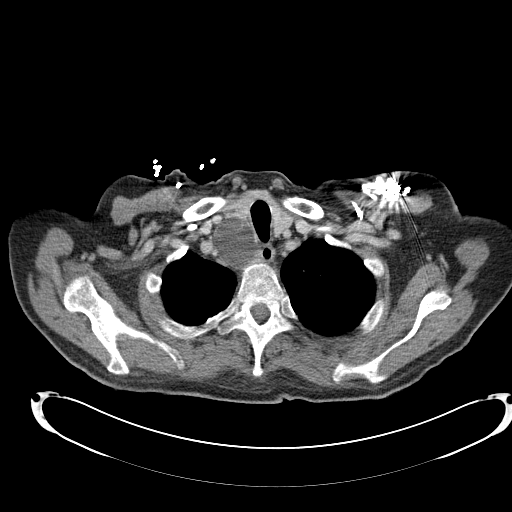
[im 205/246  lung]
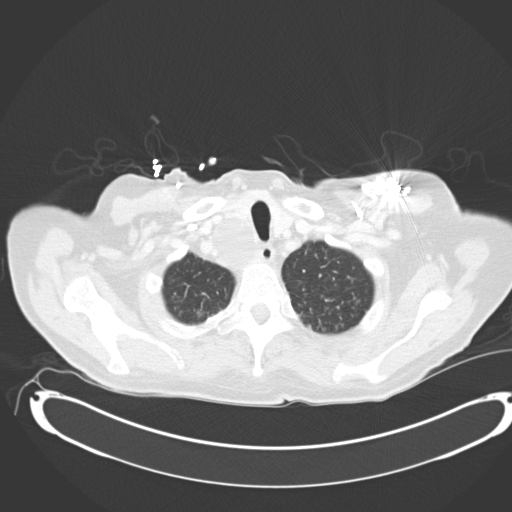
[im 218/246  lung]
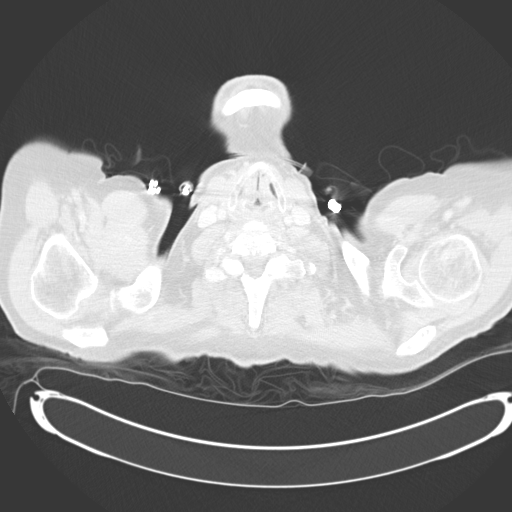
[im 232/246  soft-tissue]
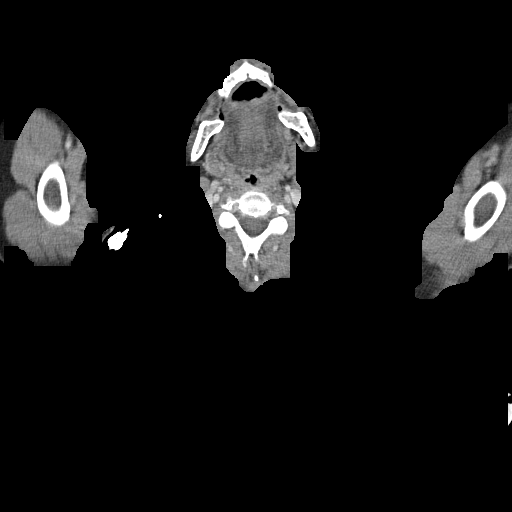
[im 232/246  lung]
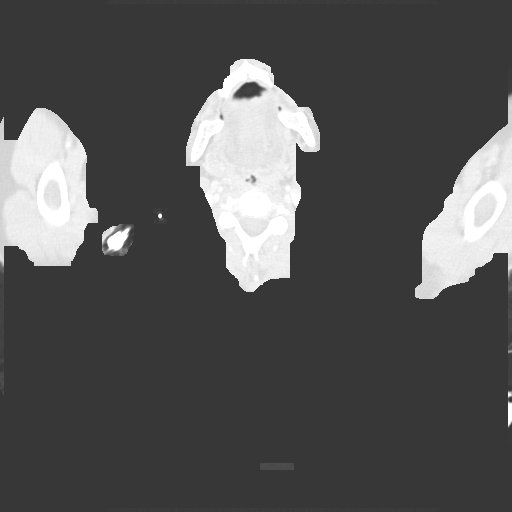

[12 of 32 positions shown; findings below may reference images not displayed]

PROCEDURE:     CT  - CT CHEST ABDOMEN AND PELVIS W  - October 09, 2011  [DATE]

RESULT:     Axial CT scanning was performed through the chest, abdomen, and
pelvis with reconstructions at 3 mm intervals and slice thicknesses.
Comparison is made to a study 19 July, 2010.

CT scan of the chest: A low-density mass posterior to the right thyroid lobe
is present. This has increased slightly in size now measuring 4.1 cm in
greatest dimension. This may be arising from the thyroid but given that it
has grown since the previous study this could reflect metastatic disease as
well. I see no bulky hilar or other mediastinal lymph nodes. The cardiac
chambers are normal in size. There is a permanent pacemaker in place. There
is a small pericardial effusion.

I see no pathologic sized mediastinal or hilar lymph nodes. There is a small
left pleural effusion. The thoracic esophagus exhibits no focal mass nor
evidence of obstruction.

At lung window settings there are emphysematous changes bilaterally. There
is new parenchymal consolidation in both lower lobes in the paravertebral
regions posteriorly. This may be related to radiation therapy but infection
or lymphangitic spread of malignancy is not excluded. There are new areas of
patchy increased interstitial and confluent alveolar density in both upper
lobes but especially on the right. There is some involvement in the upper
aspects of the lower lobes separate from the previously described areas of
consolidation in the lower paravertebral regions bilaterally.

The thoracic vertebral bodies are preserved in height. I see no lytic nor
blastic rib lesion.
CONCLUSION: 1. Since the previous study there has developed a small left pleural
effusion and small pericardial effusion. There is new consolidation of
portions of the lower lobes posteriorly and medially in the paravertebral
regions. While this could reflect pneumonia there are likely post radiation
effects is the patient has undergone radiation.
2. There has been a progression in pulmonary parenchymal disease in both
lungs fairly diffusely especially in the right upper lobe that is worrisome
for lymphangitic spread of malignancy or less likely for pneumonia.
3. I do not see definite osseous metastatic disease of the thorax.
4. A paratracheal low density mass posterior to the right thyroid lobe is
again demonstrated. It has increased in size to approximately 4.1 cm since
the previous study.

CT scan of the abdomen and pelvis: The patient received only a small amount
of oral contrast material.

The liver exhibits new subtle hypodensity anteriorly measuring just under 2
cm in diameter in the left lobe on image 123 through 127. The stomach is
nondistended a gastrostomy tube is present. There is a large mass associated
with the head of the pancreas which has increased in size since the previous
study. This may reflect an intrinsic pancreatic mass or could reflect
surrounding lymphadenopathy. The maximal dimension of this mass is 6.6 cm on
image 135. There are surgical clips an parenchymal calcification associated
with the body and tail of the pancreas. No pancreatic ductal dilation is
seen. The gallbladder is adequately distended with no evidence of stones.
The spleen is not enlarged. I see no adrenal masses. There are
nonobstructing stones in both kidneys. The caliber of the abdominal aorta is
normal.

There is some motion artifact present but as best as can be determined the
bowel exhibits no acute abnormality. There is sigmoid diverticulosis. The
urinary bladder and prostate gland are normal in appearance.

At bone window settings I see no lytic or blastic lesions of the lumbar
spine or pelvis. There is no evidence of a compression fracture.
IMPRESSION: 1. Please see the discussion above regarding findings in the thorax.
2. Again demonstrated is a mass associated with or just inferior and
posterior to the head of the pancreas. This has increased in size since the
previous study and now measures 7 cm transversely by 3.8 cm AP.
3. There is a 2 cm subtle hypodensity in the subcapsular region of the left
hepatic lobe anteriorly. This may reflect a new metastatic focus.
4. I do not see definite evidence of acute bowel abnormality nor acute
urinary tract abnormality. There is sigmoid diverticulosis and there are
nonobstructing stones in both kidneys.

## 2014-08-30 NOTE — Consult Note (Signed)
PATIENT NAME:  Luke Gill, Luke Gill MR#:  [id number] DATE OF BIRTH:  09/02/1950  DATE OF CONSULTATION:  10/06/2011  REFERRING PHYSICIAN:   CONSULTING PHYSICIAN:  Gonzella Lex, MD  IDENTIFYING INFORMATION AND REASON FOR CONSULT: 64 year old man with advanced esophageal cancer admitted for progressive weakness and anemia. Consult for treatment of depression.   HISTORY OF PRESENT ILLNESS: Information obtained from the patient and from the chart. Patient reports that his mood is feeling increasingly down and sad and anxious. He feels anxious much of the time but has periods when it gets worse. He is starting to feel more hopeless. He has started to have some thoughts about wishing that it was all over. He specifically denies having any thoughts or intent of actually trying to kill himself. He feels sick and tired of his illness. He is particularly depressed by the feeling of being out of control of himself. He sleeps poorly at night. Feels tired and weak much of the time. He does not have any psychotic symptoms. He has been getting an antidepressant of sertraline 50 mg through his PEG tube which is a continuation of an antidepressant that he had been on for several years even prior to his cancer. He is not seeing a psychiatrist or specific mental health provider.   PAST PSYCHIATRIC HISTORY: Started getting treatment for depression soon after getting an implantable defibrillator and pacemaker. His primary care doctor or cardiologist noticed that he was feeling more depressed and suggested him starting Zoloft. He did think that it was helpful at the time. He has no history of suicide attempt. No history of psychiatric hospitalization. Denies any history of substance abuse problem. Denies that he had been treated for any psychiatric illness prior to about 2008.   SOCIAL HISTORY: Patient used to work as a Cabin crew at [employer]. Obviously, unable to work now because of his severe illness. He is  married and his relationship with his wife is the closest and most important thing in his life. He does not have any children. Most of his biological family are deceased and he is not close with any of them. The only other relatives he has much contact with are his wife's family. He is not particularly strongly religious.   PAST MEDICAL HISTORY: Patient has stage IV esophageal cancer. He tells me that when he was diagnosed it was about a year and a half ago and at that time he was told that he probably only had a few months to live. He says that his doctors have told him that they are surprised that he has lasted as long as he has now. He feels like he has grown increasingly unable to do anything or to take care of himself. He does, however, say that recently he got a mobility scooter which he has appreciated because it at least lets him to go outdoors. Patient is unable to eat on his own because of his tumor and has a PEG tube. He has history of cardiac disease and history of heart failure. He has a pacemaker and a defibrillator implanted.   MENTAL STATUS EXAM: Patient was awake when I went to find him and was pleasant and appropriately interactive. Good eye contact. Psychomotor activity limited by his illness. Speech was quiet in tone but easy to understand, normal under these physical circumstances. Affect was a little bit flattened and dysphoric but not tearful. Mood stated as depressed. Thoughts are lucid with no evidence of loosening of associations or delusional thinking  or psychotic thinking. Denies hallucinations. Says he has had some thoughts about wishing that he would just go ahead and die but has no intent of hurting himself, knows that it would be too traumatic for his wife. No homicidal ideation. Good judgment and insight.   REVIEW OF SYSTEMS: As mentioned above, he complains of depressed mood, generalized anxiety much of the time, feeling afraid of falling. Worry about his wife. Poor sleep at  night. Weakness generally all over.   ASSESSMENT: This is a 64 year old man with major depression in the context of an overwhelming illness which has caused severe disability. Obviously under these circumstances some degree of sadness, regret and anxiety are normal, however, it seems like his has gotten worse and been more impairing and is part of an ongoing problem that he has had in the past. Supportive therapy done with him. Encouraged him to communicate with staff and with his wife about what he is feeling, what he is afraid of, what can be done that would help him to feel better. Would encourage everyone to try and find as many ways to make him independent as are possible. As far as specific depression treatment, he is currently on Zoloft 50 mg a day that he gets as concentrated liquid through his PEG tube. I suggest that we increase the dose of this, go up to 100 mg a day. I have gone ahead and put in an order to that effect. I have told him that he should report if he feels like he is having side effects, if he is getting more stomach upset, stomach pain or fatigue or any other side effect that seems to be related to the increased dose. Will go ahead and follow. I do think the sertraline is the best course for now given that he has had response to in the past, it is available as a concentrate so we do not have to worry about how to get it down the PEG tube.   DIAGNOSIS PRINCIPLE AND PRIMARY:  AXIS I: Major depressive disorder, moderate to severe, recurrent.   SECONDARY DIAGNOSES:  AXIS I: No further.   AXIS II: No diagnosis.   AXIS III: Esophageal cancer, congestive heart failure.   AXIS IV: Severe stress from the degree of illness that he is having and the potential mortality.   AXIS V: Functioning at time of admission 30.   ____________________________ Gonzella Lex, MD jtc:cms D: 10/06/2011 18:42:05 ET T: 10/07/2011 10:41:10 ET JOB#: [id number]  cc: Gonzella Lex, MD,  <Dictator> Gonzella Lex MD ELECTRONICALLY SIGNED 10/09/2011 11:34

## 2014-08-30 NOTE — Consult Note (Signed)
PATIENT NAME:  Luke Gill, Luke Gill MR#:  [id number] DATE OF BIRTH:  1950-07-25  DATE OF CONSULTATION:  10/06/2011  REFERRING PHYSICIAN:  Dr. Lyndel Safe CONSULTING PHYSICIAN:  Aarya Quebedeaux R. Ma Hillock, MD  REASON FOR CONSULTATION: Stage IV esophageal cancer with acute anemia.   HISTORY OF PRESENT ILLNESS: Patient is a 64 year old gentleman with known history of stage IV metastatic esophageal cancer who has received palliative radiation along with chemotherapy which he completed in November 2011. Patient also has history of hypertension, sleep apnea, congestive heart failure status post implantable cardiac defibrillator placement (EF has been as low at 10% in the past, but more recent echo showed some improvement to 45% to 50%), history of Barrett's esophagus in 2011, gastroesophageal reflux disease, aspiration pneumonia after which J-tube was placed and overnight feeding was done. Most recent CT scan evaluation about 2 to 3 months ago also did not show any evidence of progression of metastatic disease. Patient was admitted to hospital about 8 to 9 days ago with significant anemia and was discharged after blood transfusion, work-up shows that he had severe iron deficiency, he is unable to take iron supplement either by mouth or through feeding tube. He has now been admitted with progressive weakness and worsening depression. Hemoglobin again today is very low at 6.5. He is having dark black stools. Denies other bleeding symptoms. Appetite is poor, states that he is now doing tube feeding as bolus one can 4 to 5 times a day as tolerated.   PAST MEDICAL/SURGICAL HISTORY: As in history of present illness above.   FAMILY HISTORY: Noncontributory.   SOCIAL HISTORY: Currently denies any smoking, alcohol or recreational drug usage.   ALLERGIES: No known drug allergies.   HOME MEDICATIONS:  1. Spiriva HandiHaler once daily.  2. Symbicort 2 puffs b.i.d.  3. Albuterol inhaler p.r.n.  4. Sertraline liquid 2.5 mL (50 mg at  bedtime). 5. Isosource 1.5, 1 can 5 times a day, flushes with 120 mL water 5 times a day.   REVIEW OF SYSTEMS: CONSTITUTIONAL: Severe weakness, does not do much activity physically. No fever or chills. HEENT: Denies any headaches, epistaxis, ear or jaw pain. No new sinus symptoms. CARDIAC: Has dyspnea on activity. No angina, orthopnea, PND or palpitation. LUNGS: Has chronic cough which is with scanty whitish sputum. No hemoptysis or new chest pain. GASTROINTESTINAL: Intermittent nausea. Does tube feeding 5 times a day. No diarrhea. No bright red blood in stools but has dark, black stools. Intermittent upper abdominal pain. MUSCULOSKELETAL: No new bone pain. EXTREMITIES: Has chronic swelling, no new pain. SKIN: No new rashes or pruritus. HEMATOLOGIC: No other bleeding symptoms except dark, black stools. NEUROLOGIC: Denies any loss of consciousness, seizures, or new focal weakness. ENDOCRINE: No polyuria or polydipsia.   PHYSICAL EXAMINATION:  GENERAL: Patient is very weak, poorly nourished individual, no acute distress, alert and oriented. No icterus. Pallor present.   VITAL SIGNS: Temperature 98.5, pulse 92, respiratory rate 20, blood pressure 121/69, 93% on 3 liters.   HEENT: Normocephalic, atraumatic. Extraocular movements intact. Sclera anicteric. No oral thrush.   NECK: Negative for lymphadenopathy.   CARDIOVASCULAR: S1, S2, regular rate and rhythm.   LUNGS: Lungs show bilateral diminished breath sounds especially at bases, no rhonchi.   ABDOMEN: Abdomen shows mild epigastric tenderness. Feeding tube present. No distention, guarding, or rigidity. Bowel sounds normally heard.   EXTREMITIES: Mild edema. No cyanosis.   SKIN: No generalized rashes or major bruising.   NEUROLOGIC: Cranial nerves are intact, moves all extremities spontaneously.   MUSCULOSKELETAL:  No obvious joint deformity or swelling.   LABORATORY, DIAGNOSTIC, AND RADIOLOGICAL DATA: Creatinine 0.83, potassium 3.9, calcium  12.1. Liver functions unremarkable except albumin of 2.4, PTT 23, BNP 700, WBC 16,700, hemoglobin 6.5, platelets 204, INR 1.1. Ferritin 38. Recent labs on 09/27/2011 have also shown low iron saturation of 4%, low serum iron of 13, TIBC 037, folic acid and C48 were normal.   IMPRESSION AND RECOMMENDATION: 64 year old gentleman with known history of multiple medical problems including stage IV metastatic esophageal cancer who has completed palliative radiation along with Taxol/carboplatin chemotherapy in November 2011, since then has been on surveillance. Most recent CT scan was in March which did not show any definite evidence of progressive metastatic disease. Patient clinically has had episodes of aspiration pneumonia, and has continued to chronically decline and have failure to thrive issues. He now presents with recurrent anemia which is severely symptomatic, is being hospitalized for the second time in the last 10 days for anemia and getting blood transfusion. Hemoglobin is down to 6.5, agree with planned transfusion support. He has severe iron deficiency, once packed red blood cell transfusions are done, will subsequently plan parenteral iron therapy since he will be unable to take iron orally or through feeding tube. Also has dark, black stools and agree with GI evaluation for endoscopy to look for bleeding source. With regards to malignancy, will also get follow-up CT scan of the chest, abdomen and pelvis with IV contrast only (unable to take oral contrast) on Monday, 06/03, for restaging malignancy. Will continue to follow, continue packed red blood cell transfusion as indicated by hemoglobin values. Patient explained above, agreeable to this plan.   Thank you for the referral. Please feel free to contact me if any additional questions.   ____________________________ Rhett Bannister Ma Hillock, MD srp:cms D: 10/06/2011 23:47:43 ET T: 10/07/2011 08:50:41 ET JOB#: [id number]  cc: Taimane Stimmel R. Ma Hillock, MD,  <Dictator> Alveta Heimlich MD ELECTRONICALLY SIGNED 10/07/2011 23:28

## 2014-08-30 NOTE — Discharge Summary (Signed)
PATIENT NAME:  Luke Gill, Luke Gill MR#:  [id number] DATE OF BIRTH:  1950/07/31  DATE OF ADMISSION:  10/06/2011 DATE OF DISCHARGE:  10/10/2011  PRIMARY CARE PHYSICIAN: Tresa Garter, NP   PRESENTING COMPLAINT: Weakness and anemia.  DISCHARGE DIAGNOSES:  1. Acute on chronic anemia due to esophageal ulcer, status post esophagogastroduodenoscopy.  2. Recurrent esophageal cancer with metastasis.  3. Depression.  4. Status post chronic percutaneous endoscopic gastrostomy tube feeding.  5. Chronic obstructive pulmonary disease.   CONDITION ON DISCHARGE: Fair.   MEDICATIONS:  1. Spiriva 18 mcg inhalation daily.  2. Symbicort 160/4.5, 2 puffs b.i.d.  3. Ventolin HFA 2 puffs inhaled as needed.  4. Zoloft 20 mg/mL, 2.5 mL once a day at bedtime.  5. Isosource 1.5, 5 cans a day through PEG tube 3 hours apart.  6. Clonazepam 0.25 mg disintegrating tablets every 6 hours as needed.  7. Lansoprazole 30 mg disintegrating tablet, 1 tablet b.i.d.  8. Metoclopramide 5 mg via  PEG tube q.i.d.  9. Resume ranitidine as before at bedtime.  DIET: PEG feeding.   FOLLOWUP:  1. Follow up with Dr. Ma Hillock on Wednesday, June 12th at 10:45 a.m.  2. The patient will be discharged home with Hospice.   LABORATORY, DIAGNOSTIC AND RADIOLOGICAL DATA:  Hemoglobin at discharge 9.6.  CT of the chest, abdomen and pelvis shows a small left pleural effusion, small pericardial effusion. There has been progression in pulmonary parenchymal disease in lungs fairly diffusely, especially in the right upper lobe, that is worrisome for lymphangitic spread of malignancy or less likely for pneumonia. Paratracheal low-density mass posterior to the right thyroid lobe is again demonstrated. The liver exhibits diffuse subtle hypodensity measuring just under 2 cm in the left lobe. The stomach is nondistended with a gastrostomy tube. There is a large mass associated with the head of the pancreas which has increased in size since previous study  and is about 6.6 cm.  White count is 12.5. Platelet count is 184.  Wound culture around the PEG tube shows consistent with normal flora.  Creatinine 0.95, sodium 145, potassium 3.6, chloride 104, bicarbonate 336.  Blood cultures are negative in 36 hours.   BRIEF SUMMARY OF HOSPITAL COURSE: Mr. Pepper is a 64 year old Caucasian gentleman well known to our service with multiple medical problems, presents with history of chronic obstructive pulmonary disease, esophageal cancer status post radiation and chemotherapy, now on chronic PEG feedings with history of chronic anemia, came in with shortness of breath, weakness, noted to be severely anemic. He was admitted with:   1. Anemia with melanotic stools, likely due to upper GI bleed: He  is status post endoscopy which showed esophageal ulcer. He is status post 2 units of blood transfusion. Hemoglobin now is 9.6. PPI was continued, Reglan along with ranitidine will be continued. He has restarted tube feedings and is tolerating well.  2. Chronic obstructive pulmonary disease: No exacerbation. Continue his inhalers, and he is on home oxygen. His chest CT showed possible lymphangitic spread in his lungs.  3. History of esophageal cancer with recurrence and metastasis: I spoke with Dr. Ma Hillock who discussed with the patient and wife. With the overall poor prognosis, he recommends no chemotherapy at present, and Hospice was arranged to follow up at home.  4. Depression/anxiety: The patient was seen by Dr. Weber Cooks and was continued on Zoloft and started Klonopin wafers.   The hospital stay otherwise remained stable.   CODE STATUS:  The patient is a NO CODE/DO NOT RESUSCITATE and will  be followed by Hospice at home.       TIME SPENT: 40 minutes.   ____________________________ Hart Rochester Posey Pronto, MD sap:cbb D: 10/10/2011 15:40:18 ET T: 10/11/2011 11:10:06 ET JOB#: [id number]  cc: Travius Crochet A. Posey Pronto, MD, <Dictator> Sandeep R. Ma Hillock, MD Gonzella Lex, MD Ilda Basset MD ELECTRONICALLY SIGNED 10/14/2011 14:09

## 2014-08-30 NOTE — Consult Note (Signed)
Details:    - Psychiatry: Saw patient. No major change in mental status. Still depressed but not suicidal. Does occasionally get some anxiety. He says his only experience with benzos was having taken 1 dose of something that knocked him out. He does, though, think something for anxiety might be helpful at times. Trying to think of the best way to administer I think a klonopin wafer, 1/2 wafer (0.25mg ) q6hr per PEG prn anxiety would be useful and unlikely to cause problems. Continue elevated dose zoloft, too.   Electronic Signatures: Gonzella Lex (MD)  (Signed 03-Jun-13 10:53)  Authored: Details   Last Updated: 03-Jun-13 10:53 by Gonzella Lex (MD)

## 2014-08-30 NOTE — Consult Note (Signed)
Chief Complaint:   Subjective/Chief Complaint stable, no evidence of further bleeding.  no abdominal pain or nausea.   VITAL SIGNS/ANCILLARY NOTES: **Vital Signs.:   03-Jun-13 10:09   Vital Signs Type Q 4hr   Temperature Temperature (F) 98   Celsius 36.6   Temperature Source oral   Pulse Pulse 99   Pulse source per Dinamap   Respirations Respirations 20   Systolic BP Systolic BP 469   Diastolic BP (mmHg) Diastolic BP (mmHg) 77   Mean BP 91   BP Source vital sign device   Pulse Ox % Pulse Ox % 93   Pulse Ox Activity Level  At rest   Oxygen Delivery Nasal Cannula; 2.5L   Brief Assessment:   Cardiac Regular    Respiratory rhonchi    Gastrointestinal details normal Soft  Nontender  Nondistended  No masses palpable  Bowel sounds normal   Lab Results:  Routine Hem:  31-May-13 11:05    Hemoglobin (CBC)  6.5  01-Jun-13 04:36    Hemoglobin (CBC)  8.2  02-Jun-13 05:06    Hemoglobin (CBC)  7.8  03-Jun-13 05:37    WBC (CBC)  12.5   RBC (CBC)  3.32   Hemoglobin (CBC)  8.6   Hematocrit (CBC)  27.7   Platelet Count (CBC) 184   MCV 84   MCH 26.0   MCHC  31.1   RDW  17.8   Neutrophil % 87.0   Lymphocyte % 4.2   Monocyte % 7.1   Eosinophil % 1.3   Basophil % 0.4   Neutrophil #  10.9   Lymphocyte #  0.5   Monocyte # 0.9   Eosinophil # 0.2   Basophil # 0.0 (Result(s) reported on 09 Oct 2011 at [facility].)   Assessment/Plan:  Assessment/Plan:   Assessment 1) recurrent anemia heme positive/black stool in the setting of h/o esophageal Ca. EGD yesterday showing 360 degree distal esophageal ulceration and narrowing.  scope not passed beyond due to this.  Patietn had been stable and no evidence of refluxed blood. currently on bid ppi/stable.  patietn discontinued acid supression at home.    Plan 1) continue current.  repeat egd in 3-4 weeks unless there is recurrent bleeding sooner. I have changed ppi to prevacid rapidly disintegrating solutab bid.  this needs to be continued at  hom, may be able to trial small amount of non-carbonated clears.   Electronic Signatures: Loistine Simas (MD)  (Signed 03-Jun-13 14:13)  Authored: Chief Complaint, VITAL SIGNS/ANCILLARY NOTES, Brief Assessment, Lab Results, Assessment/Plan   Last Updated: 03-Jun-13 14:13 by Loistine Simas (MD)

## 2014-08-30 NOTE — Consult Note (Signed)
Brief Consult Note: Diagnosis: Major depression.   Patient was seen by consultant.   Consult note dictated.   Recommend further assessment or treatment.   Orders entered.   Comments: Psychiatry: Patient seen. Note dictated. Major depression and general anxiety. Obvious severe stresses. No acute suicidal intent or plan. Prior responce to current dose zoloft. I suggest increase to 100mg  qhs. He agrees. Supportive therapy done. Will follow. Does not need sitter.  Electronic Signatures: Gonzella Lex (MD)  (Signed 31-May-13 18:33)  Authored: Brief Consult Note   Last Updated: 31-May-13 18:33 by Gonzella Lex (MD)

## 2014-08-30 NOTE — Discharge Summary (Signed)
PATIENT NAME:  Luke Gill, Luke Gill MR#:  [id number] DATE OF BIRTH:  1950-09-21  DATE OF ADMISSION:  04/18/2011 DATE OF DISCHARGE:  04/28/2011  DISCHARGE DIAGNOSES:  1. Aspiration pneumonia.  2. PEG tube changed by Surgery and Gastroenterology.  3. Stage IV esophageal cancer.   HISTORY OF PRESENT ILLNESS: The patient is a 64 year old gentleman with known history of metastatic stage IV esophageal cancer status post palliative radiation therapy and palliative chemotherapy (last chemotherapy was November 2011) and has been on observation. More recently the patient had a CT scan which was suggestive of increasing/new pulmonary opacities and was given longer course of antibiotic with Augmentin along with prednisone. The patient presented to the [facility] for follow-up on 04/18/2011 with progressive cough, sputum. He had poor oral intake along with dehydration, hypotension, and was also having low-grade fever. He was feeling weak and lightheaded on getting up and ambulating, but he denied any falls or loss of consciousness. He also had some unintentional weight loss. The patient was concerned that sputum was yellowish but also was the exact color of the Isosource tube feeding.  He also had developed recent shingles rash over the left lower chest, but he denied any pain.   For past medical history, family history, social history, review of systems, medications, exam, and labs - please refer to History and Physical note for details.   HOSPITAL COURSE: The patient was admitted to the oncology floor and was felt to have pneumonia likely secondary to aspiration, especially since sputum was the exact nature of his tube feeding. He had just completed a prolonged course of oral antibiotics with Augmentin along with prednisone with no improvement. He was started on broad-spectrum IV antibiotic coverage with Rocephin and clindamycin. Speech therapy was asked to evaluate for aspiration risk and the opinion was that the  patient already had been evaluated recently and had significant risk of aspiration for the entire length of his esophagus being abnormal functionally. Given history of cardiomyopathy echocardiogram was repeated and ejection fraction was fairly steady at around 50%. The patient was given IV fluids with half-normal at 125 mL/hour for dehydration. For shingles rash in the left mid thoracic segment, he was started on acyclovir. The patient also opted for DO NOT RESUSCITATE status and this was ordered accordingly. Gastroenterology and Surgery were consulted for problems with feeding tube. The patient had his feeding tube changed so that he had a double lumen with the tube feeds going into the duodenum/jejunum to present reflux and aspiration of Isosource and another lumen going into the stomach for his medications. With this the patient was then restarted on tube feedings with Isosource 1.5 given continuous at 100 mL/hour for 12 hours every day along with free water flushes. With this schedule and new tube, the patient did not have worsening respiratory symptoms and his cough and sputum started improving fairly quickly. He remained afebrile. He did have some of shingle lesions open up and he was therefore placed on isolation, per hospital guidelines, and was continued on acyclovir. The patient did have anxiety, especially since he was placed in an isolation room and could not get out of the room. He was given Ativan p.r.n. and this improved his symptoms. The patient was also given TPN while he was off of tube feeding to help continue to support his nutritional status. The patient remained n.p.o. by mouth all through the hospitalization given risk of aspiration. By 04/28/2011 he continued to do better with no acute issues and felt that he  was tolerating tube feeds and that he could continue this at home. He was discharged home with instructions and also home health nurse to be set up. He was advised to follow-up at the  [facility] in three weeks with labs. In between he was advised to call or come to the ER in case of any acute sickness or recurrent symptoms. The patient was agreeable to this plan.   DISCHARGE MEDICATIONS:  1. Acyclovir liquid 400 mg via PEG three times a day x2 weeks.  2. Ativan 2 mg/mL, take 0.25 to 0.5 mL via PEG tube three times daily p.r.n. anxiety.  3. Sertraline 20 mg/mL, take 2.5 mL via PEG at bedtime.  4. Ranitidine 15 mg/mL, sit up, take 10 mL via PEG twice daily.  5. Aspirin 81 mg via PEG once daily.   DISCHARGE DIET: Isosource 1.5 continuous at 100 mL/hour for 12 hours every day. Free water flush of 25 mg/hour during feeding for 12 hours.  ACTIVITY: As tolerated.   FOLLOWUP: Followup with Dr. Leia Alf at the [facility] in three weeks with CBC, and MET-B, and LFTS. ____________________________ Rhett Bannister. Ma Hillock, MD srp:slb D: 05/14/2011 09:59:19 ET T: 05/15/2011 11:08:45 ET JOB#: [id number]  cc: Zehava Turski R. Ma Hillock, MD, <Dictator> Alveta Heimlich MD ELECTRONICALLY SIGNED 05/16/2011 23:25

## 2014-08-30 NOTE — Consult Note (Signed)
PATIENT NAME:  Luke Gill, Luke Gill MR#:  [id number] DATE OF BIRTH:  [birth year]  DATE OF CONSULTATION:  10/07/2011  REFERRING PHYSICIAN:   CONSULTING PHYSICIAN:  Lollie Sails, MD  GASTROENTEROLOGY PROGRESS NOTE  10/07/2011 at 9 a.m.  SUBJECTIVE:  The patient is seen and examined. Tolerated transfusions overnight. Denies any abdominal pain or nausea. No bowel movement or emesis overnight. No evidence of ongoing GI bleeding. He is not handling secretions very well and is spitting some materials into a kidney basin.   PHYSICAL EXAMINATION:  VITAL SIGNS: Temperature 97.8, pulse 100, respirations 20, blood pressure 129/88, oxygen saturation between 98 and 94% on 3 liters nasal cannula.    HEART: Regular rate and rhythm.   LUNGS: Bilaterally clear, occasional rhonchi.   ABDOMEN: Soft, nondistended, nontender. Bowel sounds are positive. G-tube in the left upper quadrant.   LABORATORY DATA: Reviewed today as follows: Glucose 115, BUN 24, creatinine 0.95, sodium 145, potassium 3.6, chloride 104, bicarbonate 36, calcium 11.7. White blood cell count of 15.3, hemoglobin 8.2, hematocrit 25.4, platelet count 167, MCV 82. He had a prothrombin time yesterday of 14.4, INR 1.1. Chest x-ray yesterday showed increased density at both lung bases, most compatible with chronic inflammatory change and probable history of bronchiectasis. Faint density projected over the third right anterior rib. Heart size normal. Port-A-Cath present.   ASSESSMENT: Melena, possible upper gastrointestinal bleed with recurrent anemia. He has been stable overnight without recurrent evidence of bleeding.     RECOMMENDATION:  EGD with possible dilatation. Further recommendations to follow. I have discussed the risks, benefits, and complications of these procedures to include but not limited to bleeding, infection, perforation, and the risks of sedation, to both the patient and his wife and they wish to proceed. Further recommendations to  follow.    ____________________________ Lollie Sails, MD mus:bjt D: 10/07/2011 22:15:31 ET T: 10/08/2011 11:14:59 ET JOB#: [id number]  cc: Lollie Sails, MD, <Dictator> Lollie Sails MD ELECTRONICALLY SIGNED 10/10/2011 17:16

## 2014-08-30 NOTE — Consult Note (Signed)
Brief Consult Note: Diagnosis: G tube drainage.   Patient was seen by consultant.   Comments: Moss tube (PEG-J) replaced by [facility] and positioned beyond pylorus by Dr Dionne Milo Dec 2012. J port cracked so pt using G port for bolus TFs. Has dark mucus drainage from gastrostomy under tube flange. This is normal, but I snugged the tube up to the skin and replaced his dressing with a small piece of telfa and showed pt how to do this. I placed a silk tie around bolster / flange to prevent slippage in future. If tube fails a standard G tube could be placed in the future since J port not being used anyway. Will sign off.  Electronic Signatures: Consuela Mimes (MD)  (Signed [id number] 09:27)  Authored: Brief Consult Note   Last Updated: 23-May-13 09:27 by Consuela Mimes (MD)

## 2014-08-30 NOTE — Consult Note (Signed)
Brief Consult Note: Diagnosis: melena, anemia.   Patient was seen by consultant.   Consult note dictated.   Recommend to proceed with surgery or procedure.   Recommend further assessment or treatment.   Orders entered.   Discussed with Attending MD.   Comments: Please see consult # [phone].  Patietn seen and examined, full consult dictated.  Patietn admitted with increasing fatigue, found to have anemia and black stools/heme positive.   [place] of grade iv esophageal Cancer with previous radiation tx and chemotherapy.   History of esophageal  stricture.  Patietn recently stopped outpatietn ranitidine.   Pateitn was in the hospital last week with symptomatic anemia, recieved tfx and discharged.  Patietn with progressive weight loss and multiple medical problems.  Recommend continuing iv ppi and tfx as you are.  Recommend also EGD for luminal evaluation.  patient is hemodynamically stable.  Will reevaluate in the am prior to EGD.  Will need anesthesia assistance due to nature of multiple medical problems.  Electronic Signatures: Loistine Simas (MD)  (Signed 31-May-13 19:40)  Authored: Brief Consult Note   Last Updated: 31-May-13 19:40 by Loistine Simas (MD)

## 2014-08-30 NOTE — Discharge Summary (Signed)
PATIENT NAME:  Luke Gill, REBSTOCK MR#:  [id number] DATE OF BIRTH:  13-Dec-1950  DATE OF ADMISSION:  09/27/2011 DATE OF DISCHARGE:  09/29/2011  HISTORY OF PRESENT ILLNESS: For a detailed note, please take a look at the history and physical done on admission by Dr. Lisbeth Renshaw.   DIAGNOSES AT DISCHARGE:  1. Acute on chronic symptomatic anemia, now much improved.  2. History of chronic obstructive pulmonary disease. 3. History of esophageal cancer.  4. Depression.   DIET: The patient is being discharged on his maintenance of his tube feedings via his GJ tube.   ACTIVITY: As tolerated.   DISCHARGE INSTRUCTIONS: He is being discharged with home health physical therapy. Follow-up in the next 1 to 2 weeks with Dr. Golden Pop.   DISCHARGE MEDICATIONS:  1. Spiriva 1 puff daily.  2. Symbicort 2 puffs b.i.d.  3. Albuterol inhaler as needed. 4. Zoloft 50 mg at bedtime.   [place] COURSE:  1. Dr. Dionne Milo from gastroenterology.  2. Dr. Leanora Cover from general surgery.   PERTINENT STUDIES: Chest x-ray done on admission showing no evidence of any acute cardiopulmonary disease, presence up a pacemaker and a Port-A-Cath.   HOSPITAL COURSE: This is a 64 year old male with medical problems as mentioned above presented to the hospital with shortness of breath and lethargy secondary to symptomatic anemia.    1. Acute on chronic anemia. The patient was symptomatic as he was significantly short of breath and lethargic. His anemia was not thought to be related to an acute GI blood loss as he has had a recent EGD and a colonoscopy. The patient was seen by Dr. Dionne Milo who thought that his anemia was likely chronic in nature. The patient was transfused 1 unit of packed red blood cells. His hemoglobin since then has improved and clinically he has improved. He is currently being discharged home with close follow-up with his oncologist Dr. Ma Hillock as an outpatient.  2. Chronic obstructive pulmonary  disease. The patient did not have any evidence of acute chronic obstructive pulmonary disease exacerbation. He will continue his maintenance inhalers as mentioned.  3. Stage IV esophageal cancer. As mentioned, the patient is followed by Dr. Ma Hillock. He does not take anything by mouth. He does have a PEG tube. He has a GJ tube. Apparently the J port in the tube was not working. The patient was seen by Dr. Leanora Cover covering for surgery. He was able to unclog the G port but not the J. port. He has been restarted back on his tube feedings which he is not tolerating. Any further problems related to his GJ tube can be monitored as an outpatient.  4. Depression. The patient was maintained on his Zoloft. He will resume that upon discharge.   CODE STATUS: The patient is a DO NOT INTUBATE, DO NOT RESUSCITATE.   TIME SPENT: 40 minutes.  ____________________________ Belia Heman. Verdell Carmine, MD vjs:rbg D: 09/29/2011 15:35:39 ET T: 10/02/2011 11:40:32 ET JOB#: [id number]  cc: Belia Heman. Verdell Carmine, MD, <Dictator> Guadalupe Maple, MD Henreitta Leber MD ELECTRONICALLY SIGNED 10/02/2011 15:25

## 2014-08-30 NOTE — Consult Note (Signed)
PATIENT NAME:  Luke Gill, Luke Gill MR#:  [id number] DATE OF BIRTH:  1950/05/18  DATE OF CONSULTATION:  09/28/2011  REFERRING PHYSICIAN:   CONSULTING PHYSICIAN:  Jill Side, MD  PRIMARY CARE PHYSICIAN: Golden Pop, MD.   REASON FOR CONSULTATION: Anemia.   HISTORY OF PRESENT ILLNESS: This is a 64 year old male with history of stage IV esophageal cancer, status post chemotherapy and radiation in the past, although he has not had any chemoradiation in at least the last six months, history of chronic obstructive pulmonary disease, heart failure. The patient was admitted yesterday with increasing shortness of breath and fatigue. He has been on tube feedings. He was found to be anemic with a hemoglobin of 7.6. He denies any nausea, vomiting, melena, hematemesis, or any other significant gastrointestinal symptoms. There was some leakage around the PEG tube which has been repositioned by Dr. Leanora Cover. It appears to be doing well. As mentioned above, the patient denies any specific gastrointestinal symptoms. He was just found to be anemic with a hemoglobin of 7.6.   PAST MEDICAL HISTORY:  1. History of stage IV cancer of the esophagus. Most recent esophagogastroduodenoscopy done about five months ago did not show any esophageal lesions, although it appears that the patient has local invasion.  2. History of PEG tube placement.  3. History of diastolic heart failure.   HOME MEDICATIONS:  1. Zoloft. 2. Spiriva. 3. Symbicort.  4. Ventolin.   ALLERGIES: None.   FAMILY HISTORY: Unremarkable.   REVIEW OF SYSTEMS: Grossly negative except for what is mentioned in the History of Present Illness.   PHYSICAL EXAMINATION:  GENERAL: Somewhat cachectic male who does not appear to be in any acute distress.   VITAL SIGNS: Temperature 98, pulse 94, respirations 20, blood pressure 134/84.   HEENT: Unremarkable.   LUNGS: Clear to auscultation bilaterally.   CARDIOVASCULAR: Regular rate and rhythm. No  gallops or murmur.   ABDOMEN: Quite soft and benign. Bowel sounds positive. Nontender, nondistended. PEG tube site looks okay.   NEUROLOGIC: Examination appears to be grossly unremarkable as well.   LABS/STUDIES: White cell count is 16,000. Hemoglobin was 7.6 on admission, is 7.7 today. The patient has received a unit of packed RBC and his second unit is pending. Iron saturation is 4%. Electrolytes and liver enzymes are unremarkable.   ASSESSMENT AND PLAN: The patient with anemia with low iron saturation. This is most likely secondary to nutritional deficiency or anemia of chronic disease. There are no signs of active gastrointestinal blood loss and the patient denies any significant gastrointestinal symptoms. The patient had a recent upper gastrointestinal endoscopy about four or five months ago which was quite unremarkable. The patient had a colonoscopy in 2011 which was unremarkable as well. I would recommend transfusing to a hemoglobin of 8.5. If there is no further blood loss and his hemoglobin and hematocrit remain stable, I do not believe any further gastrointestinal work-up is indicated at this time. The patient should stay on iron replacement. We will follow and further recommendations to follow.   ____________________________ Jill Side, MD si:ap D: 09/28/2011 12:20:20 ET             T: 09/28/2011 15:12:44 ET                JOB#: [id number] cc: Jill Side, MD, <Dictator> Jill Side MD ELECTRONICALLY SIGNED 10/06/2011 18:35

## 2014-08-30 NOTE — Consult Note (Signed)
Chief Complaint:   Subjective/Chief Complaint stable, no recurrent bleeding, slight decrease hgb today to 7.8 from 8.2.  denies n/v or abdominal pain.  very depressed.   VITAL SIGNS/ANCILLARY NOTES: **Vital Signs.:   02-Jun-13 09:09   Vital Signs Type Q 4hr   Temperature Temperature (F) 98   Celsius 36.6   Temperature Source axillary   Pulse Pulse 93   Pulse source per Dinamap   Respirations Respirations 18   Systolic BP Systolic BP 295   Diastolic BP (mmHg) Diastolic BP (mmHg) 70   Mean BP 83   BP Source vital sign device   Pulse Ox % Pulse Ox % 92   Pulse Ox Activity Level  At rest   Oxygen Delivery 3L   Brief Assessment:   Cardiac Regular    Respiratory rhonchi  bilateral    Gastrointestinal details normal Soft  Nontender  Nondistended  No masses palpable  Bowel sounds normal   Assessment/Plan:  Assessment/Plan:   Assessment 1) melena/anemia-egd showing circumferential esophageal ulceration above the GEj with stenosis.  Did not pass scope beyond that lesion as clinically patient seemes to have stopped bleeding and diliatation of the distal esophagus high risk for other injury. Patietn had stopped acid supression treatment at home.  Currently stable.    Plan 1) agree with tfx one unit prbc today, continue daily hgb.  continue iv ppi for now, reglan. I have restarted tf's at a lower rate today to advance tomorrow.  Also tomorrow will trial prevacid 30 mg rapidly disintegrating tabs, can dissolve in the mouth with ice to assist swallowing. Possible repeat egd in several weeks. depending on clinical situation. 2) appreciate Psychiatry assistance   Electronic Signatures: Loistine Simas (MD)  (Signed 02-Jun-13 12:45)  Authored: Chief Complaint, VITAL SIGNS/ANCILLARY NOTES, Brief Assessment, Assessment/Plan   Last Updated: 02-Jun-13 12:45 by Loistine Simas (MD)

## 2014-08-30 NOTE — Consult Note (Signed)
PATIENT NAME:  Luke, Gill MR#:  [id number] DATE OF BIRTH:  [birth year]  DATE OF CONSULTATION:  10/06/2011  REFERRING PHYSICIAN:  Dr. Lyndel Safe  CONSULTING PHYSICIAN:  Lollie Sails, MD  REASON FOR CONSULTATION: GI bleed.   HISTORY OF PRESENT ILLNESS: Luke Gill is a 64 year old Caucasian male with whom I am familiar. He has a personal history of stage IV esophageal cancer and is followed by Dr. Ma Hillock. And has not taken food orally for quite some time, at least several years and derives nutrition from a PEG tube/Moss tube. He was recently admitted to the hospital for shortness of breath and fatigue, found to be anemic, was followed during the hospitalization, received 2 units of blood and discharged two days later. Patient states that on the day he was discharged he had noted a black bowel movement. He did not notice earlier. Since that time he has had several black bowel movements at home, the last one being yesterday. His weakness seemed to increase over the course of the interim and he came back to the Emergency Room earlier today and was found to be further anemic than when he was in the hospital and was hospitalized. He states that he has had no nausea, vomiting, or abdominal pain. He has a bowel movement about every other day. He has seen no bright red blood, however, the black bowel movements as noted. There is no mucus in the stools. He denies any problems with heartburn although he has severe dysphagia and the only thing he takes orally is ice chips. His last EGD was done by Dr. Dionne Milo who is his primary GI physician in December 2012. At that time, it was not necessary to do a dilatation to access the stomach. He has continued to lose weight. He was on ranitidine previously but had stopped this at home. He is on no ulcer prophylaxis at home apparently although he has been started on proton pump inhibitor on this hospitalization. He denies the use of any aspirin or NSAID products. He does take  Tylenol. He has no previous history of peptic ulcer disease. His last colonoscopy was November 2011. At that time showing some diverticulosis and internal hemorrhoids, negative otherwise. He has had multiple EGDs over the past several years. He was recently seen at [facility] and had a pharyngoscopy done by ENT. At that time also a barium swallow was done which was very difficult for him and did not go very far down the esophagus. I am uncertain as to whether they did an actual EGD at the time for further evaluation.   GI FAMILY HISTORY: Unknown as patient is adopted.     PAST MEDICAL HISTORY: 1. Stage IV esophageal cancer, follows with Dr. Loni Muse as noted. He is post chemotherapy and radiation therapy. 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 He is dependent on gastrostomy tube feedings.  2. History of chronic diastolic failure with an automatic implantable cardiac defibrillator.  3. History of chronic obstructive pulmonary disease, chronic respiratory failure and uses oxygen.  4. Chronic anemia.  5. Status post GJ tube as noted. 6. History of depression, this increasing more recently.   ALLERGIES: He has no known drug allergies.   OUTPATIENT MEDICATIONS:  1. Isosource through the PEG. 2. Sertraline 20 mg oral concentrate 2.5 mL once a day. 3. Spiriva inhaler. 4. Symbicort inhaler. 5. Ventolin inhaler.  6. Again, he was previously on ranitidine syrup 150 mg orally b.i.d. and stopped this on  his own at home.   ASSESSMENT: Possible upper GI bleed with black stools noted. There is some increased BUN. Patient has a history of esophageal cancer, has received treatment for this both radiation as well as chemotherapy. It is of note that although his last EGD allowed access to the stomach without dilatation he recently had a swallowing study at [facility] that apparently was unsuccessful. Concern would be for possible recrudescence of the esophageal lesion.  Further, he had discontinued ulcer prophylaxis. Multiple medical problems to include chronic obstructive pulmonary disease, chronic respiratory failure, chronic diastolic failure and need of an automatic implantable cardiac defibrillator.   RECOMMENDATIONS: In current setting would continue transfusions as you are. Will note increase of blood count in a.m. He is currently hemodynamically stable and although he did have a black bowel movement yesterday he has not been showing evidence of ongoing bleeding. I do believe that we should evaluate the stomach via EGD both to rule out recurrent esophageal lesion as well as possible ulcer or gastritis. In the interim, would continue on IV PPI as you are. Transfuse as needed. Daily hemoglobins. I have discussed the risks, benefits, and complications of EGD to include but not limited to bleeding, infection, perforation, and the risk of sedation and he wishes to proceed. I will need to enlist anesthesia assistance for this procedure due to his complex medical problems.   ____________________________ Lollie Sails, MD mus:cms D: 10/06/2011 19:31:14 ET T: 10/07/2011 08:25:10 ET JOB#: [id number]  cc: Lollie Sails, MD, <Dictator> Jill Side, MD Vickki Muff. Chrismon, PA Sandeep R. Ma Hillock, MD  Lollie Sails MD ELECTRONICALLY SIGNED 10/10/2011 17:16

## 2014-08-30 NOTE — Consult Note (Signed)
ONCOLOGY followup note - still weak. Denies pain issues.no fever. Denies obvious bleeding symptoms.weak, A, O x 3, NAD.           vitals - stable, afebrile           lungs - decreased BS at bases           abd - soft, NT- WBC 12.5K, Hb 8.6, platelets 184  esophageal cancer, severe anemia of iron deficiency, GI bleed - EGD reports esophageal ulcer. Hb still low but steady. Given iron def, will give IV Feraheme 510 mg today to try improve anemia quicker. Also CT scan shows liver lesion alongwith incresing size of mass in upper abdomen, patient and wife explained that this is c/w progressive metastatic cancer, that overall prognosis is poor, and that he is a poor candidate to pursue further palliative chemotherapy. Have recommended intitiating home hospice upon discharge, he is agreeable. Patient is DNR.  Electronic Signatures: Jonn Shingles (MD)  (Signed on 04-Jun-13 00:11)  Authored  Last Updated: 04-Jun-13 00:11 by Jonn Shingles (MD)

## 2014-08-30 NOTE — H&P (Signed)
PATIENT NAME:  Luke Gill, Luke Gill MR#:  [id number] DATE OF BIRTH:  11/10/1950  DATE OF ADMISSION:  10/06/2011  ADDENDUM: I confirmed the DO NOT RESUSCITATE status with the patient and the wife. He had a Living Will in the past which said DO NOT RESUSCITATE and he agrees with that. I will order DO NOT RESUSCITATE. Also informed consent for blood transfusion obtained. We will transfuse him 2 units of packed RBC transfusion to start with. ____________________________ Mena Pauls, MD ag:slb D: 10/06/2011 14:16:02 ET T: 10/06/2011 14:28:19 ET JOB#: [id number]  cc: Mena Pauls, MD, <Dictator> Mena Pauls MD ELECTRONICALLY SIGNED 10/23/2011 11:55

## 2014-08-30 NOTE — H&P (Signed)
PATIENT NAME:  Luke Gill, Luke Gill MR#:  [id number] DATE OF BIRTH:  Apr 19, 1951  DATE OF ADMISSION:  10/06/2011  PRIMARY CARE PHYSICIAN: Signe Colt, PA at [facility] GASTROENTEROLOGIST: Dr. Dionne Milo  ONCOLOGIST: Dr. Ma Hillock  CHIEF COMPLAINT: History was obtained from the patient and the wife. Feeling extremely tired, fatigues, feels depressed.   HISTORY OF PRESENT ILLNESS: A 64 year old male who has history of stage IV esophageal cancer. He has a Port-A-Cath placed. He is status post chemotherapy and radiation therapy. He is status post PEG tube placement, chronic diastolic failure, status post automatic implantable cardiac defibrillator. He has chronic obstructive pulmonary disease, chronic respiratory failure. He uses 2 liters of oxygen at home. He was recently admitted to the hospital on 09/27/2011 for shortness of breath and fatigue, found to be anemic and he was discharged on 09/29/2011. He received 2 units of packed RBC transfusion during the hospital stay. At the time of discharge his hemoglobin was in the range of 9. He was evaluated by GI at the time of discharge, Dr. Dionne Milo. Patient also had an upper GI endoscopy done in December 2012 which showed shows a benign-appearing esophageal stricture, intact gastrostomy tube with patent G tube and normal duodenum at that time. Today he came in, initial presentation was that he was depressed, he feels hopeless, he has poor appetite but he also feels tired and fatigued. He is complaining of shortness of breath but no chest pain. As per the wife he is also having dark-colored stools, almost melena, going on for like last week and he is having some dark-colored discharge from the PEG tube also. His hemoglobin was found to be 6.5 in the Emergency Room. He is being admitted for acute on chronic anemia, possible GI bleed with melena. He denies any abdominal pain. He denies any vomiting or hematemesis. As per the patient he says he feels sick of  being sick and tired all the time. Also as per the wife his anxiety level is very high and he is even afraid of walking because he is going to fall.   REVIEW OF SYSTEMS: Positive for fatigue and weakness. No acute change in vision. No headache. No dizziness. He has chronic cough with some dyspnea. He has chronic obstructive pulmonary disease. Denies any chest pain but complaining of dyspnea on exertion. No palpitations or syncope. Complaining of melena but no hematemesis. No abdominal pain. No nausea. No dysuria. No frequency. No thyroid problems. He has history of anemia. No rash. No joint pains. No swelling. No focal numbness or weakness. He is feeling depressed.   PAST MEDICAL HISTORY: 1. Stage IV esophageal cancer, follows with Dr. Ma Hillock. He has a Port-A-Cath placed. He is status post chemotherapy and radiation therapy. 2. Status post PEG tube. 3. Chronic diastolic failure. He has an automatic implantable cardiovascular defibrillator placed in the past. His last echo in December 2012 shows ejection fraction of 50% to 55%, impaired LV relaxation, right ventricular pressure is normal.  4. Chronic obstructive pulmonary disease, chronic respiratory failure, uses oxygen. 5. Depression. 6. Chronic anemia. 7. Status post GJ tube.   PAST SURGICAL HISTORY:  1. Status post GJ tube. 2. Automatic implantable cardiovascular defibrillator.  3. Port-A-Cath placement.   ALLERGIES TO MEDICATIONS: None.   HOME MEDICATIONS:  1. Sertraline 20 mg/mL 2.5 mL at bedtime.  2. Spiriva HandiHaler daily.  3. Symbicort 160/4.5, 2 puffs b.i.d.  4. Albuterol p.r.n.  5. Isosource 1.5 feeding. He has one can five times a day and he flushes  with 120 mL five times daily.    SOCIAL HISTORY: Lives with his wife. No smoking or alcohol use.   FAMILY HISTORY: He says he is adopted.   PHYSICAL EXAMINATION:  VITAL SIGNS: His vitals when he came to the Emergency Room: Temperature 96.3, heart rate 83, respiratory rate 24,  blood pressure 129/71, saturating 94% on room air. Currently heart 90, blood pressure 121/74, saturating 97% on 2 liters nasal cannula.   GENERAL: He is an elderly Caucasian male, he is cachectic, wasted, pale in appearance.   HEENT: Bilateral pupils are equal and reactive. Extraocular muscles are intact. No scleral icterus. No conjunctivitis. Oral mucosa is moist but pale. He has conjunctival pallor also.    NECK: No thyroid tenderness, enlargement or nodule. Neck is supple. No masses, nontender. No adenopathy. No JVD. No carotid bruit.   CHEST: Bilateral breath sounds are clear. No wheeze. Normal effort. No respiratory distress.    HEART: Heart sounds are regular. No murmur. Good peripheral pulses. No lower extremity edema.   ABDOMEN: He has a PEG tube in, soft, nontender. Normal bowel sounds. No hepatosplenomegaly. No bruit. No masses.   RECTAL: Deferred.   NEUROLOGIC: He is awake, alert, oriented to time, place, and person, lethargic, depressed. Cranial nerves are intact. Moving all extremities against gravity.   EXTREMITIES: No cyanosis. No clubbing.   SKIN: Appears to be pale.   LABORATORY, DIAGNOSTIC AND RADIOLOGICAL DATA: White count 16.7, hemoglobin 6.5, platelet count 204,000, MCV 81. His hemoglobin was 9 on 09/29/2011. BMP: Sodium 146, potassium 3.9, BUN 30, creatinine 0.83, calcium 12.1. BNP 700. INR 1.1. Urinalysis is essentially negative. Vitamin B12 level 902. His chest x-ray today increased density at both lung bases compatible with chronic inflammatory changes likely related to history of bronchiectasis. Same density projected over the third right anterior rib. Questionable pulmonary nodule, heart size and Port-A-Cath.   IMPRESSION:  1. Acute on chronic anemia, suspect GI bleed with melena and increased BUN, azotemia.   2. Mild hypernatremia.  3. Stage IV esophageal cancer. 4. Status post PEG tube. 5. Chronic obstructive pulmonary disease, stable. 6. Chronic respiratory  failure. 7. Chronic diastolic failure, status post automatic implantable cardiac defibrillator. 8. Depression but without any suicidal or homicidal ideation.  PLAN: A 64 year old male with multiple medical problems. He has stage IV esophageal cancer, chronic diastolic failure status post automatic implantable cardiovascular defibrillator. Today he presented for feeling depressed and hopeless, but found to be severely anemic, feeling tired and fatigued with some black stools and dark colored discharge. I am going to start him on IV proton pump inhibitor. GI will be consulted. He may need a repeat endoscopy. Will transfuse him 2 units of packed RBC to start with. Will start him on some gentle hydration. Will hold his feeds at this time. His last upper GI endoscopy was in December 2012. At that time it only showed a benign stricture and normal duodenum. His BUN is also elevated, may be consistent with GI bleed. Will continue Spiriva and Symbicort for his chronic obstructive pulmonary disease along with albuterol p.r.n. He is mildly hyponatremic. I am going to start him on some D5 water for gentle hydration. Will continue sertraline for depression but will get a psych consultation. His psych medications need to be optimized. He denies any suicidal or homicidal ideation or plan. Plan was discussed with the patient and the wife in detail. The wife says that she needs some definitive treatment for anemia before discharge. Also I am going to  check some iron studies on him. Will also call his primary oncologist, Dr. Ma Hillock.  TIME SPENT WITH ADMISSION AND COORDINATION: 55 minutes.   ____________________________ Mena Pauls, MD ag:cms D: 10/06/2011 14:03:16 ET T: 10/06/2011 14:35:34 ET JOB#: [id number]  cc: Mena Pauls, MD, <Dictator> [place] Chrismon, PA Sandeep R. Ma Hillock, MD Mena Pauls MD ELECTRONICALLY SIGNED 10/23/2011 11:56

## 2014-08-30 NOTE — Consult Note (Signed)
Chief Complaint:   Subjective/Chief Complaint Discussed with his nurse. patient has been discharged by Dr. Verdell Carmine. H and H was stable this morning. No further recommendations. Please call Dr. Candace Cruise if needed. Thanks.   Electronic Signatures: Jill Side (MD)  (Signed [phone] 15:58)  Authored: Chief Complaint   Last Updated: 24-May-13 15:58 by Jill Side (MD)

## 2014-08-30 NOTE — H&P (Signed)
PATIENT NAME:  Luke Gill MR#:  [id number] DATE OF BIRTH:  07/02/1950  DATE OF ADMISSION:  09/27/2011  PRIMARY CARE PHYSICIAN: Golden Pop, MD   CHIEF COMPLAINT: Shortness of breath and fatigue.  HISTORY OF PRESENT ILLNESS: The patient is a 64 year old with a history of stage IV esophageal cancer, status post PEG tube, chronic obstructive pulmonary disease, diastolic heart failure who presents with the above complaint. Over the past two days, the patient has had increasing shortness of breath and is just very fatigued with dyspnea on exertion. He has a cough with some clear to green sputum but no fever or chills are noted. No sick contacts. In the Emergency Room, he had a CBC which showed a hemoglobin of 7.6 and hematocrit 24.2. The patient has had a colonoscopy in 2011 which he says is normal and an EGD at that time as well without any history of ulcers. He is not taking aspirin or ibuprofen on a daily basis.   REVIEW OF SYSTEMS: CONSTITUTIONAL: No fevers. Positive fatigue and weakness. No weight loss or weight gain. EYES: No blurred or double vision, glaucoma or cataracts. ENT: No ear pain, hearing loss, seasonal allergies. RESPIRATORY: Positive cough. No wheezing or hemoptysis. Positive chronic obstructive pulmonary disease. CARDIOVASCULAR: No chest pain, palpitations, syncope, orthopnea, edema, arrhythmia. Positive dyspnea on exertion. GASTROINTESTINAL: No nausea, vomiting, diarrhea, abdominal pain. His wife noted that his PEG tube site had some really dark-colored discharge this morning. GENITOURINARY: No dysuria or hematuria. ENDOCRINE: No thyroid problems, heat or cold intolerance. SKIN: No rash or lesions. HEME/LYMPH: Positive anemia, positive easy bruising. MUSCULOSKELETAL: No limited activity. NEUROLOGICAL:  No history of cerebrovascular accident, transient ischemic attack or seizures. PSYCHIATRIC: No history of anxiety or depression.   PAST MEDICAL HISTORY:  1. Stage IV esophageal cancer.  The patient is seeing Dr. Ma Hillock. He has a Port-A-Cath placed. He is no longer receiving any chemotherapy. He does have a PEG tube placed which was placed in November 2012.  2. Gastroesophageal reflux disease. This was thought as the etiology of his stage IV esophageal cancer.  3. Diastolic dysfunction. His echo in December 2012 showed an ejection fraction of 89% with diastolic dysfunction. He does have an automatic implantable cardiac defibrillator placed.  4. History of bladder, esophagus adenocarcinoma.   SOCIAL HISTORY: The patient quit smoking about 50 years ago. No alcohol or IV drug use.   PAST SURGICAL HISTORY:  1. PEG tube placed.  2. He also has automatic implantable cardiac defibrillator.  3. Port-A-Cath placed.   ALLERGIES: No known drug allergies.   FAMILY HISTORY: No history of cancer in the family.   MEDICATIONS:  1. Zoloft 25 mg daily.  2. Spiriva 18 mcg daily.  3. Symbicort 160/4.5, 2 puffs b.i.d.  4. Ventolin HFA 2 puffs as needed.   PHYSICAL EXAMINATION:  VITAL SIGNS: Temperature 98, pulse is 101, respirations 24, blood pressure 129/90, 94% on room air.   GENERAL: The patient is alert, oriented, not in acute distress.   HEENT: Head is atraumatic. Pupils are round and reactive. Sclerae are anicteric. Mucous membranes are moist. Oropharynx is clear.   NECK: Supple without jugular venous distention, carotid bruit, or enlarged thyroid.   CARDIOVASCULAR: Regular rate and rhythm. No murmurs, gallops, or rubs. PMI is not displaced.   LUNGS: Clear to auscultation without crackles, rales, rhonchi, or wheezing. Normal to percussion.   BACK: No costovertebral angle tenderness or vertebral tenderness.  ABDOMEN: Bowel sounds are positive. Nontender, nondistended. He has G-tube with very minimal  discharge around the site .   EXTREMITIES: No clubbing, cyanosis, or edema. He has got a skin tear on the right shin.   NEUROLOGICAL:   Cranial nerves II through XII are grossly  intact.   LABORATORY, DIAGNOSTIC AND RADIOLOGICAL DATA:  Sodium 142, potassium 3.9, chloride 106, bicarbonate 32, BUN 25, creatinine 0.81, glucose 81, calcium 11.0, bilirubin 0.4, alkaline phosphatase 57, ALT 14, AST 27, total protein 6.9, albumin 2.4.  White blood cells 15.9, hemoglobin 7.6, hematocrit 25, platelets are 156, MCV is 77. Troponin less than 0.02.  Chest x-ray shows chronic changes at the bases. No evidence of pneumonia.  EKG shows a paced rhythm.   ASSESSMENT/PLAN: A 64 year old male with stage IV lung cancer, presents with symptomatic shortness of breath and lethargy secondary to acute blood loss anemia.   1. Acute blood loss anemia, unclear etiology: The patient had a colonoscopy in 2011 which was normal. I will go ahead and consult Dr. Dionne Milo, order some iron studies, and put the  patient on Protonix 40 IV every 12 hours. He has agreed to 1 unit of packed red blood cells which we will start in the Emergency Room.  2. Hypercalcemia: Possibly due to cancer. We will provide some normal saline and repeat a calcium in the a.m. Further work-up pending tomorrow morning's calcium.  3. Stage IV esophageal cancer: Seen by Dr. Ma Hillock.  4. PEG tube: We will go ahead and consult Dr. Marina Gravel for this discharge.  5. Leukocytosis: The patient has no evidence of a pneumonia on chest x-ray. He is afebrile. I will check a urinalysis, but right now I will just repeat the CBC in the a.m. This may be secondary to some mild volume depletion.   CODE STATUS:  The patient is a DO NOT RESUSCITATE status.     TIME SPENT: Approximately 55 minutes. ____________________________ Donell Beers. Benjie Karvonen, MD spm:cbb D: 09/27/2011 17:49:31 ET T: 09/27/2011 18:24:11 ET JOB#: [id number] cc: Guadalupe Maple, MD Donell Beers [place] MD ELECTRONICALLY SIGNED 09/27/2011 20:49
